# Patient Record
Sex: Male | Born: 1980 | Race: Black or African American | Hispanic: No | Smoking: Current every day smoker
Health system: Southern US, Community
[De-identification: ages and names within clinical notes are randomized; demographics above are authoritative.]

## PROBLEM LIST (undated history)

## (undated) DIAGNOSIS — I10 Essential (primary) hypertension: Secondary | ICD-10-CM

## (undated) DIAGNOSIS — M0520 Rheumatoid vasculitis with rheumatoid arthritis of unspecified site: Secondary | ICD-10-CM

## (undated) DIAGNOSIS — F1910 Other psychoactive substance abuse, uncomplicated: Secondary | ICD-10-CM

## (undated) DIAGNOSIS — M1990 Unspecified osteoarthritis, unspecified site: Secondary | ICD-10-CM

## (undated) DIAGNOSIS — M779 Enthesopathy, unspecified: Secondary | ICD-10-CM

## (undated) DIAGNOSIS — D649 Anemia, unspecified: Secondary | ICD-10-CM

## (undated) HISTORY — DX: Essential (primary) hypertension: I10

## (undated) HISTORY — DX: Other psychoactive substance abuse, uncomplicated: F19.10

## (undated) HISTORY — DX: Anemia, unspecified: D64.9

---
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
## 2001-07-15 ENCOUNTER — Emergency Department (HOSPITAL_COMMUNITY): Admission: EM | Admit: 2001-07-15 | Discharge: 2001-07-15 | Payer: Self-pay | Admitting: Emergency Medicine

## 2001-07-27 ENCOUNTER — Emergency Department (HOSPITAL_COMMUNITY): Admission: EM | Admit: 2001-07-27 | Discharge: 2001-07-27 | Payer: Self-pay | Admitting: Emergency Medicine

## 2001-09-27 ENCOUNTER — Emergency Department (HOSPITAL_COMMUNITY): Admission: EM | Admit: 2001-09-27 | Discharge: 2001-09-27 | Payer: Self-pay | Admitting: Emergency Medicine

## 2003-07-04 ENCOUNTER — Emergency Department (HOSPITAL_COMMUNITY): Admission: EM | Admit: 2003-07-04 | Discharge: 2003-07-04 | Payer: Self-pay | Admitting: Emergency Medicine

## 2005-02-24 ENCOUNTER — Emergency Department: Payer: Self-pay | Admitting: Emergency Medicine

## 2005-02-25 ENCOUNTER — Other Ambulatory Visit: Payer: Self-pay

## 2005-07-13 ENCOUNTER — Emergency Department: Payer: Self-pay | Admitting: Emergency Medicine

## 2006-02-22 ENCOUNTER — Emergency Department (HOSPITAL_COMMUNITY): Admission: EM | Admit: 2006-02-22 | Discharge: 2006-02-22 | Payer: Self-pay | Admitting: *Deleted

## 2006-03-07 ENCOUNTER — Emergency Department (HOSPITAL_COMMUNITY): Admission: EM | Admit: 2006-03-07 | Discharge: 2006-03-07 | Payer: Self-pay | Admitting: Emergency Medicine

## 2006-12-20 ENCOUNTER — Emergency Department (HOSPITAL_COMMUNITY): Admission: EM | Admit: 2006-12-20 | Discharge: 2006-12-20 | Payer: Self-pay | Admitting: Emergency Medicine

## 2007-06-01 ENCOUNTER — Emergency Department (HOSPITAL_COMMUNITY): Admission: EM | Admit: 2007-06-01 | Discharge: 2007-06-01 | Payer: Self-pay | Admitting: Emergency Medicine

## 2007-07-24 ENCOUNTER — Emergency Department (HOSPITAL_COMMUNITY): Admission: EM | Admit: 2007-07-24 | Discharge: 2007-07-24 | Payer: Self-pay | Admitting: Emergency Medicine

## 2007-08-29 ENCOUNTER — Emergency Department (HOSPITAL_COMMUNITY): Admission: EM | Admit: 2007-08-29 | Discharge: 2007-08-29 | Payer: Self-pay | Admitting: Emergency Medicine

## 2007-12-18 ENCOUNTER — Emergency Department (HOSPITAL_COMMUNITY): Admission: EM | Admit: 2007-12-18 | Discharge: 2007-12-18 | Payer: Self-pay | Admitting: Emergency Medicine

## 2008-02-19 ENCOUNTER — Emergency Department (HOSPITAL_COMMUNITY): Admission: EM | Admit: 2008-02-19 | Discharge: 2008-02-19 | Payer: Self-pay | Admitting: Emergency Medicine

## 2008-03-24 ENCOUNTER — Emergency Department (HOSPITAL_COMMUNITY): Admission: EM | Admit: 2008-03-24 | Discharge: 2008-03-24 | Payer: Self-pay | Admitting: Emergency Medicine

## 2008-08-12 ENCOUNTER — Emergency Department (HOSPITAL_COMMUNITY): Admission: EM | Admit: 2008-08-12 | Discharge: 2008-08-12 | Payer: Self-pay | Admitting: Emergency Medicine

## 2008-08-14 ENCOUNTER — Ambulatory Visit: Payer: Self-pay | Admitting: *Deleted

## 2008-08-14 ENCOUNTER — Observation Stay (HOSPITAL_COMMUNITY): Admission: EM | Admit: 2008-08-14 | Discharge: 2008-08-15 | Payer: Self-pay | Admitting: Emergency Medicine

## 2008-09-05 ENCOUNTER — Emergency Department (HOSPITAL_COMMUNITY): Admission: EM | Admit: 2008-09-05 | Discharge: 2008-09-05 | Payer: Self-pay | Admitting: Emergency Medicine

## 2008-09-30 ENCOUNTER — Emergency Department (HOSPITAL_COMMUNITY): Admission: EM | Admit: 2008-09-30 | Discharge: 2008-09-30 | Payer: Self-pay | Admitting: Emergency Medicine

## 2008-11-02 ENCOUNTER — Emergency Department (HOSPITAL_COMMUNITY): Admission: EM | Admit: 2008-11-02 | Discharge: 2008-11-02 | Payer: Self-pay | Admitting: Emergency Medicine

## 2008-11-27 ENCOUNTER — Inpatient Hospital Stay (HOSPITAL_COMMUNITY): Admission: EM | Admit: 2008-11-27 | Discharge: 2008-11-30 | Payer: Self-pay | Admitting: Emergency Medicine

## 2008-11-28 ENCOUNTER — Encounter (INDEPENDENT_AMBULATORY_CARE_PROVIDER_SITE_OTHER): Payer: Self-pay | Admitting: Internal Medicine

## 2008-11-28 ENCOUNTER — Ambulatory Visit: Payer: Self-pay | Admitting: Vascular Surgery

## 2008-12-24 ENCOUNTER — Observation Stay (HOSPITAL_COMMUNITY): Admission: EM | Admit: 2008-12-24 | Discharge: 2008-12-25 | Payer: Self-pay | Admitting: Emergency Medicine

## 2008-12-25 ENCOUNTER — Encounter: Payer: Self-pay | Admitting: Internal Medicine

## 2008-12-25 DIAGNOSIS — M109 Gout, unspecified: Secondary | ICD-10-CM

## 2009-01-10 ENCOUNTER — Emergency Department (HOSPITAL_COMMUNITY): Admission: EM | Admit: 2009-01-10 | Discharge: 2009-01-10 | Payer: Self-pay | Admitting: Emergency Medicine

## 2009-01-10 ENCOUNTER — Encounter: Payer: Self-pay | Admitting: Internal Medicine

## 2009-01-11 ENCOUNTER — Ambulatory Visit: Payer: Self-pay | Admitting: Internal Medicine

## 2009-01-11 ENCOUNTER — Ambulatory Visit (HOSPITAL_COMMUNITY): Admission: RE | Admit: 2009-01-11 | Discharge: 2009-01-11 | Payer: Self-pay | Admitting: Internal Medicine

## 2009-01-11 ENCOUNTER — Encounter (INDEPENDENT_AMBULATORY_CARE_PROVIDER_SITE_OTHER): Payer: Self-pay | Admitting: Internal Medicine

## 2009-01-11 ENCOUNTER — Ambulatory Visit (HOSPITAL_COMMUNITY): Admission: RE | Admit: 2009-01-11 | Discharge: 2009-01-11 | Payer: Self-pay | Admitting: *Deleted

## 2009-01-11 DIAGNOSIS — IMO0002 Reserved for concepts with insufficient information to code with codable children: Secondary | ICD-10-CM

## 2009-01-11 DIAGNOSIS — R079 Chest pain, unspecified: Secondary | ICD-10-CM

## 2009-01-19 ENCOUNTER — Observation Stay (HOSPITAL_COMMUNITY): Admission: AD | Admit: 2009-01-19 | Discharge: 2009-01-21 | Payer: Self-pay | Admitting: Internal Medicine

## 2009-01-19 ENCOUNTER — Telehealth: Payer: Self-pay | Admitting: *Deleted

## 2009-01-19 ENCOUNTER — Encounter (INDEPENDENT_AMBULATORY_CARE_PROVIDER_SITE_OTHER): Payer: Self-pay | Admitting: Internal Medicine

## 2009-01-21 ENCOUNTER — Encounter (INDEPENDENT_AMBULATORY_CARE_PROVIDER_SITE_OTHER): Payer: Self-pay | Admitting: Internal Medicine

## 2009-02-03 ENCOUNTER — Emergency Department (HOSPITAL_COMMUNITY): Admission: EM | Admit: 2009-02-03 | Discharge: 2009-02-03 | Payer: Self-pay | Admitting: Emergency Medicine

## 2009-03-19 ENCOUNTER — Emergency Department (HOSPITAL_COMMUNITY): Admission: EM | Admit: 2009-03-19 | Discharge: 2009-03-19 | Payer: Self-pay | Admitting: Emergency Medicine

## 2009-04-16 ENCOUNTER — Emergency Department (HOSPITAL_COMMUNITY): Admission: EM | Admit: 2009-04-16 | Discharge: 2009-04-16 | Payer: Self-pay | Admitting: Emergency Medicine

## 2009-04-17 ENCOUNTER — Emergency Department (HOSPITAL_COMMUNITY): Admission: EM | Admit: 2009-04-17 | Discharge: 2009-04-17 | Payer: Self-pay | Admitting: Emergency Medicine

## 2009-04-17 ENCOUNTER — Telehealth (INDEPENDENT_AMBULATORY_CARE_PROVIDER_SITE_OTHER): Payer: Self-pay | Admitting: Internal Medicine

## 2009-04-20 ENCOUNTER — Ambulatory Visit: Payer: Self-pay | Admitting: Internal Medicine

## 2009-04-20 ENCOUNTER — Encounter: Payer: Self-pay | Admitting: Internal Medicine

## 2009-04-20 DIAGNOSIS — K219 Gastro-esophageal reflux disease without esophagitis: Secondary | ICD-10-CM

## 2009-04-20 LAB — CONVERTED CEMR LAB
Calcium: 9.4 mg/dL (ref 8.4–10.5)
Creatinine, Ser: 1.04 mg/dL (ref 0.40–1.50)
Glucose, Bld: 88 mg/dL (ref 70–99)
Sodium: 142 meq/L (ref 135–145)

## 2009-05-27 ENCOUNTER — Emergency Department (HOSPITAL_COMMUNITY): Admission: EM | Admit: 2009-05-27 | Discharge: 2009-05-27 | Payer: Self-pay | Admitting: Emergency Medicine

## 2009-06-26 ENCOUNTER — Emergency Department (HOSPITAL_COMMUNITY): Admission: EM | Admit: 2009-06-26 | Discharge: 2009-06-26 | Payer: Self-pay | Admitting: Emergency Medicine

## 2009-06-30 ENCOUNTER — Emergency Department (HOSPITAL_COMMUNITY): Admission: EM | Admit: 2009-06-30 | Discharge: 2009-06-30 | Payer: Self-pay | Admitting: Emergency Medicine

## 2009-09-03 ENCOUNTER — Emergency Department (HOSPITAL_COMMUNITY): Admission: EM | Admit: 2009-09-03 | Discharge: 2009-09-03 | Payer: Self-pay | Admitting: Emergency Medicine

## 2009-09-15 ENCOUNTER — Emergency Department (HOSPITAL_COMMUNITY): Admission: EM | Admit: 2009-09-15 | Discharge: 2009-09-15 | Payer: Self-pay | Admitting: Emergency Medicine

## 2009-09-17 ENCOUNTER — Emergency Department (HOSPITAL_COMMUNITY): Admission: EM | Admit: 2009-09-17 | Discharge: 2009-09-17 | Payer: Self-pay | Admitting: Emergency Medicine

## 2009-10-02 ENCOUNTER — Emergency Department (HOSPITAL_COMMUNITY): Admission: EM | Admit: 2009-10-02 | Discharge: 2009-10-03 | Payer: Self-pay | Admitting: Emergency Medicine

## 2009-10-04 ENCOUNTER — Ambulatory Visit: Payer: Self-pay | Admitting: Internal Medicine

## 2009-10-04 ENCOUNTER — Observation Stay (HOSPITAL_COMMUNITY): Admission: EM | Admit: 2009-10-04 | Discharge: 2009-10-05 | Payer: Self-pay | Admitting: Emergency Medicine

## 2009-10-05 ENCOUNTER — Encounter: Payer: Self-pay | Admitting: Internal Medicine

## 2009-10-30 ENCOUNTER — Ambulatory Visit: Payer: Self-pay | Admitting: Internal Medicine

## 2009-10-30 DIAGNOSIS — M79609 Pain in unspecified limb: Secondary | ICD-10-CM

## 2009-11-10 ENCOUNTER — Emergency Department (HOSPITAL_COMMUNITY): Admission: EM | Admit: 2009-11-10 | Discharge: 2009-11-10 | Payer: Self-pay | Admitting: Emergency Medicine

## 2009-12-10 ENCOUNTER — Emergency Department (HOSPITAL_COMMUNITY): Admission: EM | Admit: 2009-12-10 | Discharge: 2009-12-10 | Payer: Self-pay | Admitting: Emergency Medicine

## 2010-01-04 ENCOUNTER — Emergency Department (HOSPITAL_COMMUNITY): Admission: EM | Admit: 2010-01-04 | Discharge: 2010-01-05 | Payer: Self-pay | Admitting: Emergency Medicine

## 2010-02-02 ENCOUNTER — Ambulatory Visit: Payer: Self-pay | Admitting: Internal Medicine

## 2010-02-02 ENCOUNTER — Encounter: Payer: Self-pay | Admitting: Internal Medicine

## 2010-02-02 DIAGNOSIS — M25569 Pain in unspecified knee: Secondary | ICD-10-CM

## 2010-02-02 LAB — CONVERTED CEMR LAB
ALT: 26 units/L (ref 0–53)
AST: 29 units/L (ref 0–37)
Albumin: 4.2 g/dL (ref 3.5–5.2)
Alkaline Phosphatase: 58 units/L (ref 39–117)
BUN: 11 mg/dL (ref 6–23)
Basophils Absolute: 0 10*3/uL (ref 0.0–0.1)
Calcium: 9.6 mg/dL (ref 8.4–10.5)
Chloride: 107 meq/L (ref 96–112)
Eosinophils Relative: 1 % (ref 0–5)
HCT: 43 % (ref 39.0–52.0)
Lymphocytes Relative: 18 % (ref 12–46)
Lymphs Abs: 2.7 10*3/uL (ref 0.7–4.0)
Neutro Abs: 10.8 10*3/uL — ABNORMAL HIGH (ref 1.7–7.7)
Platelets: 206 10*3/uL (ref 150–400)
Potassium: 4.4 meq/L (ref 3.5–5.3)
Sodium: 138 meq/L (ref 135–145)
Total Protein: 7.2 g/dL (ref 6.0–8.3)
WBC: 14.9 10*3/uL — ABNORMAL HIGH (ref 4.0–10.5)

## 2010-02-03 ENCOUNTER — Ambulatory Visit (HOSPITAL_COMMUNITY): Admission: RE | Admit: 2010-02-03 | Discharge: 2010-02-03 | Payer: Self-pay | Admitting: Internal Medicine

## 2010-02-07 ENCOUNTER — Ambulatory Visit: Payer: Self-pay | Admitting: Internal Medicine

## 2010-02-07 LAB — CONVERTED CEMR LAB
ALT: 21 units/L (ref 0–53)
AST: 27 units/L (ref 0–37)
BUN: 16 mg/dL (ref 6–23)
Calcium: 10.1 mg/dL (ref 8.4–10.5)
Creatinine, Ser: 1.09 mg/dL (ref 0.40–1.50)
Total Bilirubin: 0.3 mg/dL (ref 0.3–1.2)

## 2010-02-08 IMAGING — CR DG FOOT COMPLETE 3+V*R*
3 series · 3 of 3 positions shown · non-contrast
Comparison: None available.

CLINICAL DATA: Foot pain and swelling.

RIGHT FOOT COMPLETE - 3+ VIEW

[t foot ap right]
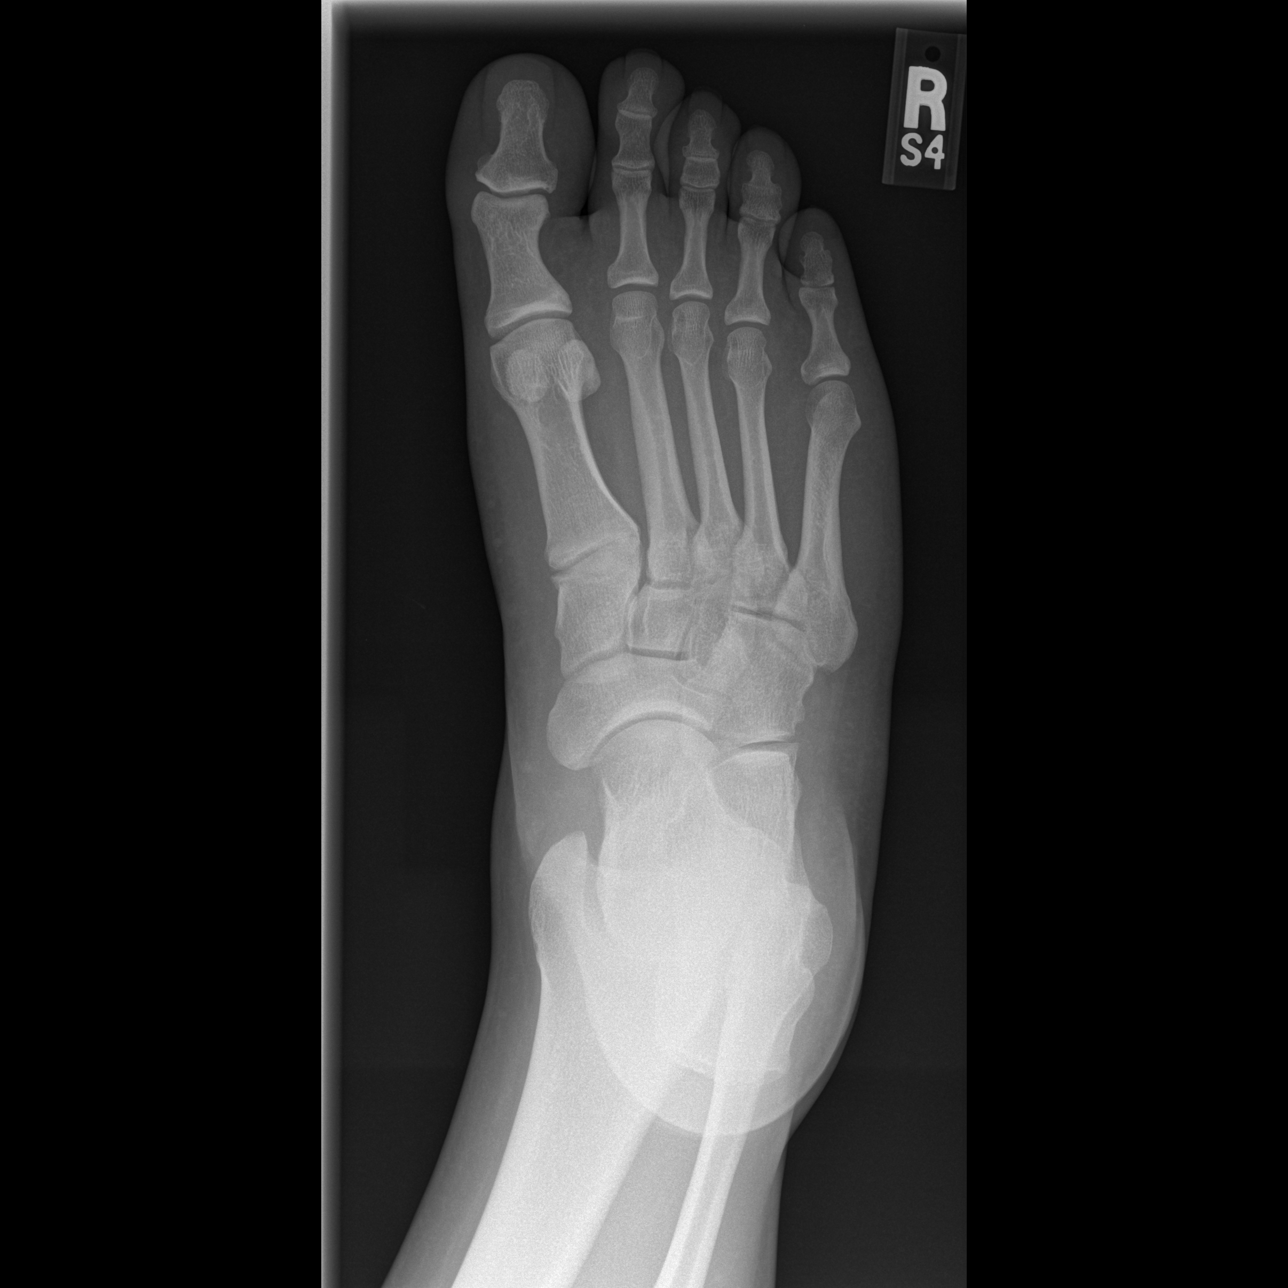

[t foot oblique right]
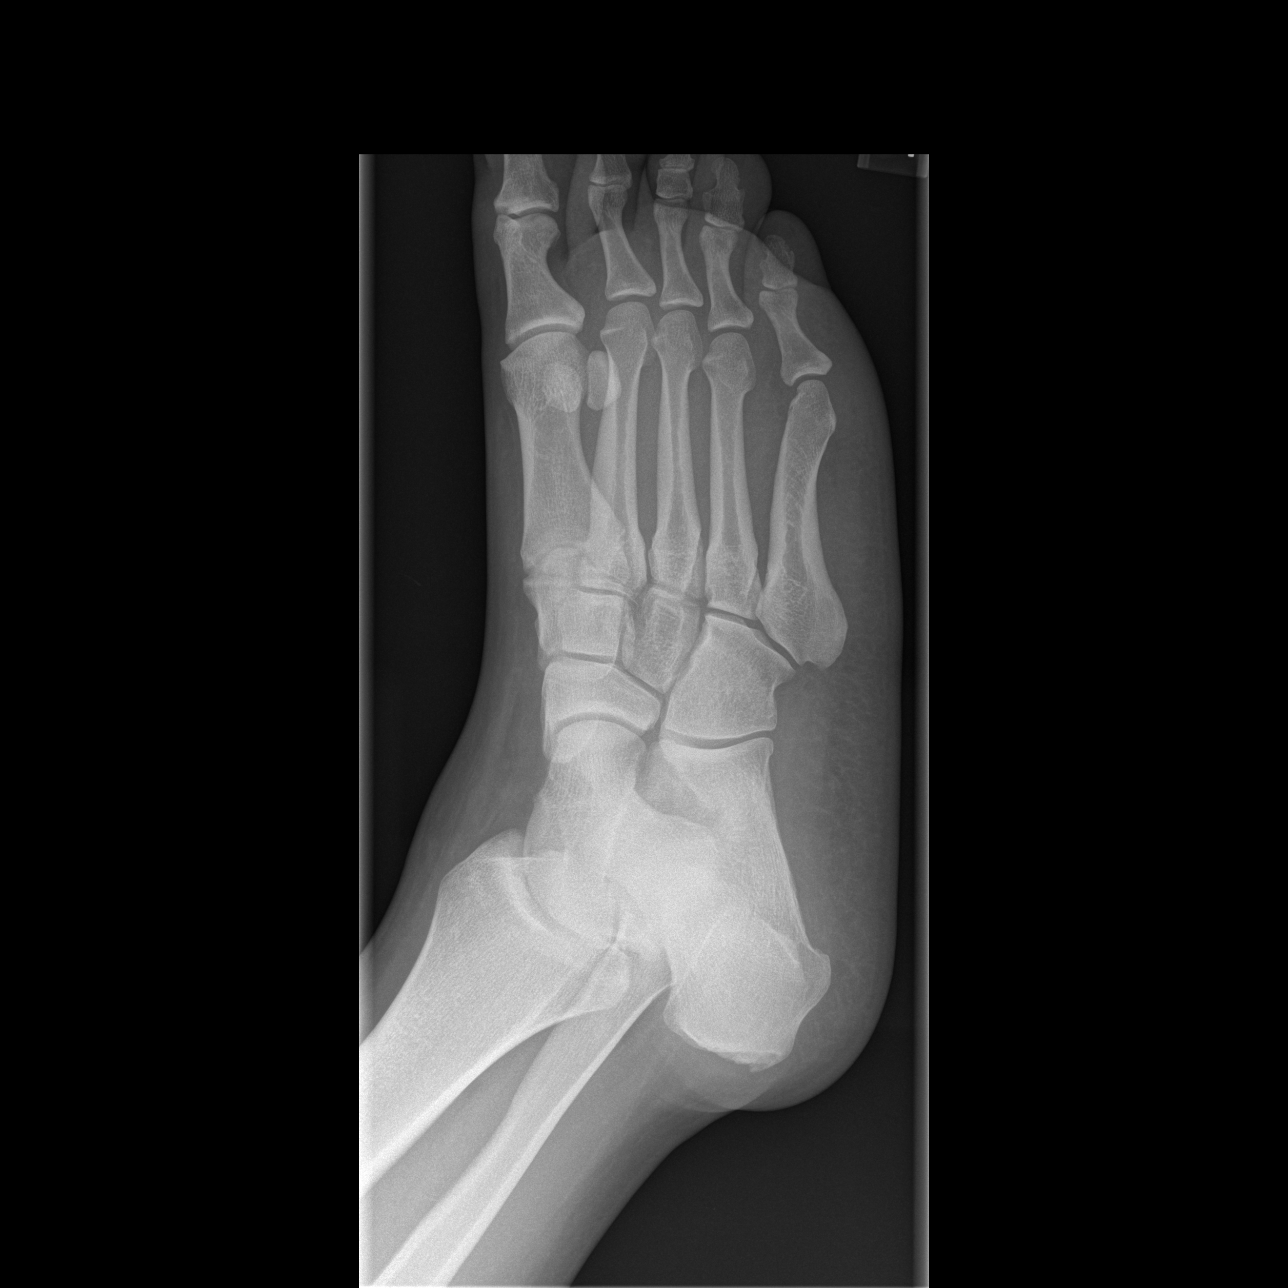

[t foot lat right]
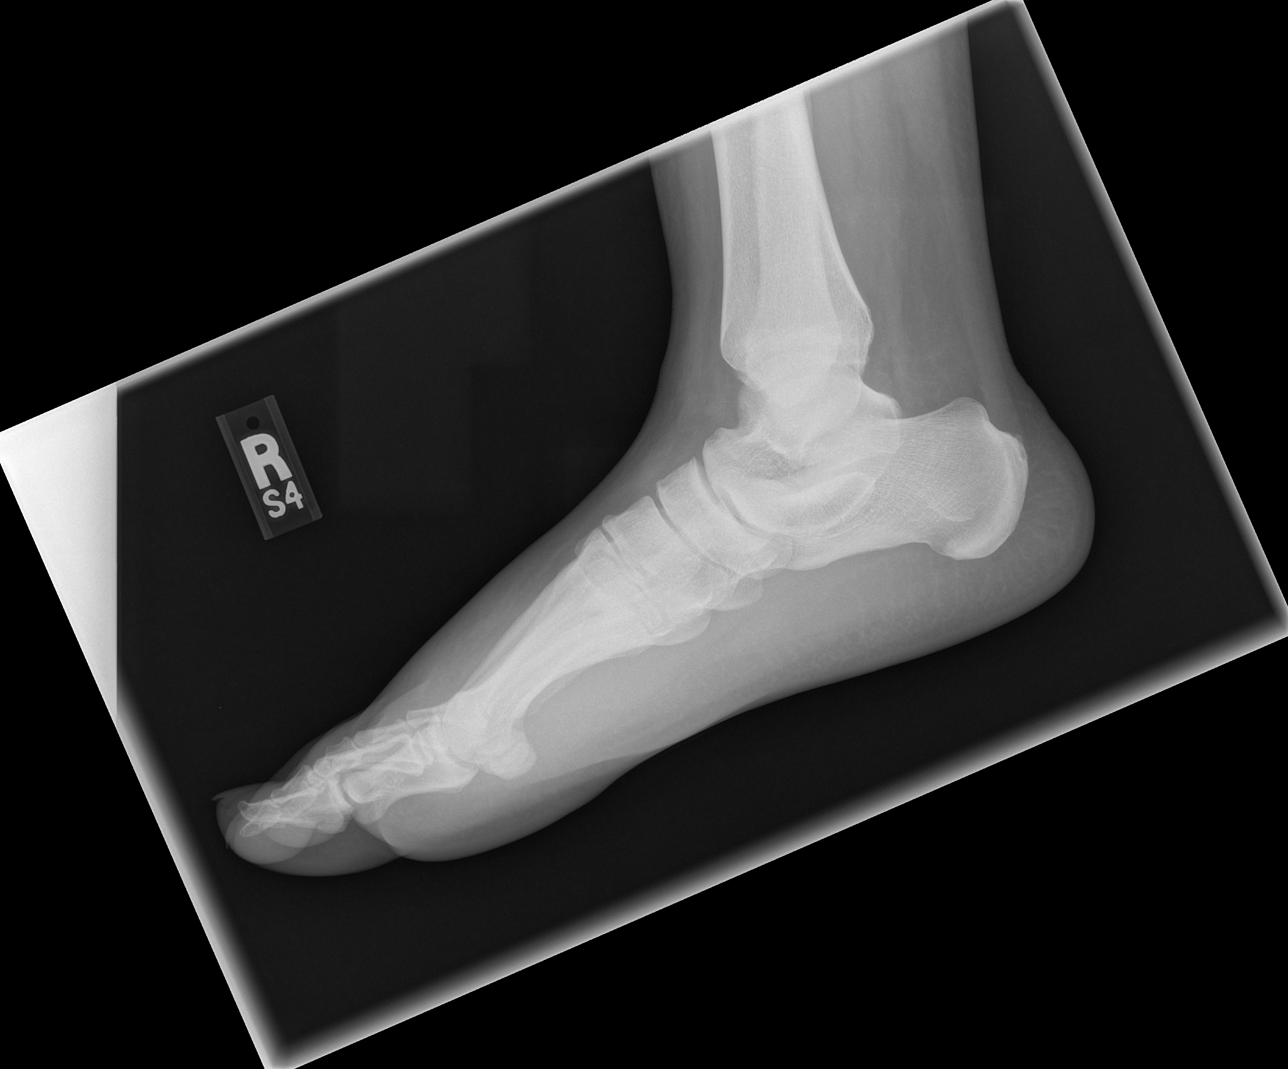

[3 of 3 positions shown; findings below may reference images not displayed]

FINDINGS: Imaged bones, joints and soft tissues appear normal.
IMPRESSION: Negative exam.

## 2010-02-14 ENCOUNTER — Emergency Department (HOSPITAL_COMMUNITY): Admission: EM | Admit: 2010-02-14 | Discharge: 2010-02-14 | Payer: Self-pay | Admitting: Emergency Medicine

## 2010-02-22 ENCOUNTER — Emergency Department (HOSPITAL_COMMUNITY): Admission: EM | Admit: 2010-02-22 | Discharge: 2010-02-22 | Payer: Self-pay | Admitting: Family Medicine

## 2010-03-21 ENCOUNTER — Ambulatory Visit (HOSPITAL_COMMUNITY): Admission: RE | Admit: 2010-03-21 | Discharge: 2010-03-21 | Payer: Self-pay | Admitting: Internal Medicine

## 2010-03-21 ENCOUNTER — Ambulatory Visit: Payer: Self-pay | Admitting: Internal Medicine

## 2010-03-21 ENCOUNTER — Encounter (INDEPENDENT_AMBULATORY_CARE_PROVIDER_SITE_OTHER): Payer: Self-pay | Admitting: Internal Medicine

## 2010-03-21 DIAGNOSIS — M25559 Pain in unspecified hip: Secondary | ICD-10-CM

## 2010-03-26 DIAGNOSIS — R748 Abnormal levels of other serum enzymes: Secondary | ICD-10-CM | POA: Insufficient documentation

## 2010-03-27 LAB — CONVERTED CEMR LAB
Amphetamine Screen, Ur: NEGATIVE
Basophils Absolute: 0 10*3/uL (ref 0.0–0.1)
CRP: 0.2 mg/dL (ref <0.6)
Cocaine Metabolites: NEGATIVE
Creatinine,U: 202.6 mg/dL
Eosinophils Absolute: 0.2 10*3/uL (ref 0.0–0.7)
Eosinophils Relative: 2 % (ref 0–5)
HCT: 39.8 % (ref 39.0–52.0)
Hemoglobin: 13.2 g/dL (ref 13.0–17.0)
Lymphocytes Relative: 23 % (ref 12–46)
Lymphs Abs: 3 10*3/uL (ref 0.7–4.0)
MCV: 68.9 fL — ABNORMAL LOW (ref >=78.0)
Monocytes Absolute: 1 10*3/uL (ref 0.1–1.0)
Phencyclidine (PCP): NEGATIVE
Platelets: 283 10*3/uL (ref 150–400)
RDW: 15.5 % (ref 11.5–15.5)
Total CK: 805 units/L — ABNORMAL HIGH (ref 7–232)

## 2010-04-03 ENCOUNTER — Telehealth: Payer: Self-pay | Admitting: *Deleted

## 2010-04-04 ENCOUNTER — Telehealth: Payer: Self-pay | Admitting: *Deleted

## 2010-05-22 ENCOUNTER — Emergency Department (HOSPITAL_COMMUNITY): Admission: EM | Admit: 2010-05-22 | Discharge: 2010-05-23 | Payer: Self-pay | Admitting: Emergency Medicine

## 2010-06-12 ENCOUNTER — Emergency Department (HOSPITAL_COMMUNITY): Admission: EM | Admit: 2010-06-12 | Discharge: 2010-06-13 | Payer: Self-pay | Admitting: Emergency Medicine

## 2010-07-04 ENCOUNTER — Emergency Department (HOSPITAL_COMMUNITY): Admission: EM | Admit: 2010-07-04 | Discharge: 2010-07-04 | Payer: Self-pay | Admitting: Emergency Medicine

## 2010-07-05 ENCOUNTER — Ambulatory Visit: Payer: Self-pay | Admitting: Vascular Surgery

## 2010-07-05 ENCOUNTER — Emergency Department (HOSPITAL_COMMUNITY): Admission: EM | Admit: 2010-07-05 | Discharge: 2010-07-05 | Payer: Self-pay | Admitting: Emergency Medicine

## 2010-07-05 ENCOUNTER — Encounter (INDEPENDENT_AMBULATORY_CARE_PROVIDER_SITE_OTHER): Payer: Self-pay | Admitting: Emergency Medicine

## 2010-07-05 ENCOUNTER — Telehealth: Payer: Self-pay | Admitting: Internal Medicine

## 2010-07-23 ENCOUNTER — Emergency Department (HOSPITAL_COMMUNITY): Admission: EM | Admit: 2010-07-23 | Discharge: 2010-07-23 | Payer: Self-pay | Admitting: Emergency Medicine

## 2010-09-27 ENCOUNTER — Emergency Department (HOSPITAL_COMMUNITY): Admission: EM | Admit: 2010-09-27 | Discharge: 2010-02-13 | Payer: Self-pay | Admitting: Emergency Medicine

## 2010-11-12 ENCOUNTER — Emergency Department (HOSPITAL_COMMUNITY)
Admission: EM | Admit: 2010-11-12 | Discharge: 2010-11-12 | Payer: Self-pay | Source: Home / Self Care | Admitting: Emergency Medicine

## 2010-11-13 LAB — CBC
HCT: 37.5 % — ABNORMAL LOW (ref 39.0–52.0)
Hemoglobin: 12.9 g/dL — ABNORMAL LOW (ref 13.0–17.0)
MCH: 23 pg — ABNORMAL LOW (ref 26.0–34.0)
MCHC: 34.4 g/dL (ref 30.0–36.0)
MCV: 67 fL — ABNORMAL LOW (ref 78.0–100.0)
Platelets: 204 K/uL (ref 150–400)
RBC: 5.6 MIL/uL (ref 4.22–5.81)
RDW: 14.4 % (ref 11.5–15.5)
WBC: 14.2 10*3/uL — ABNORMAL HIGH (ref 4.0–10.5)

## 2010-11-13 LAB — DIFFERENTIAL
Basophils Absolute: 0 K/uL (ref 0.0–0.1)
Basophils Relative: 0 % (ref 0–1)
Eosinophils Absolute: 0.1 K/uL (ref 0.0–0.7)
Eosinophils Relative: 1 % (ref 0–5)
Lymphocytes Relative: 31 % (ref 12–46)
Lymphs Abs: 4.4 K/uL — ABNORMAL HIGH (ref 0.7–4.0)
Monocytes Absolute: 1.1 K/uL — ABNORMAL HIGH (ref 0.1–1.0)
Monocytes Relative: 8 % (ref 3–12)
Neutro Abs: 8.6 K/uL — ABNORMAL HIGH (ref 1.7–7.7)
Neutrophils Relative %: 60 % (ref 43–77)

## 2010-11-13 LAB — COMPREHENSIVE METABOLIC PANEL
ALT: 20 U/L (ref 0–53)
Albumin: 3.7 g/dL (ref 3.5–5.2)
Alkaline Phosphatase: 53 U/L (ref 39–117)
BUN: 11 mg/dL (ref 6–23)
Chloride: 108 mEq/L (ref 96–112)
Glucose, Bld: 99 mg/dL (ref 70–99)
Potassium: 3.9 mEq/L (ref 3.5–5.1)
Sodium: 140 mEq/L (ref 135–145)
Total Bilirubin: 0.7 mg/dL (ref 0.3–1.2)

## 2010-11-13 LAB — COMPREHENSIVE METABOLIC PANEL WITH GFR
AST: 25 U/L (ref 0–37)
CO2: 25 meq/L (ref 19–32)
Calcium: 9.4 mg/dL (ref 8.4–10.5)
Creatinine, Ser: 1.14 mg/dL (ref 0.4–1.5)
GFR calc Af Amer: >60 mL/min (ref >60)
GFR calc non Af Amer: >60 mL/min (ref >60)
Total Protein: 6.5 g/dL (ref 6.0–8.3)

## 2010-11-13 LAB — LIPASE, BLOOD: Lipase: 24 U/L (ref 11–59)

## 2010-11-20 NOTE — Letter (Signed)
Summary: RETURN TO WORK NOTE  RETURN TO WORK NOTE   Imported By: Margie Billet 03/28/2010 11:02:17  _____________________________________________________________________  External Attachment:    Type:   Image     Comment:   External Document

## 2010-11-20 NOTE — Letter (Signed)
Summary: Generic on Letterhead Paper  [facility]  [address]   [place], [place] [address]   Phone: [phone]  Fax: [phone]     Today's Date:  February 02, 2010  Re:  [name]   To Whom It May Concern:   For Medical reasons, please excuse the above named employee from work for the following dates:  Start: 02/02/2010  End: 02/17/2010  If you need additional information, please feel free to contact our office at [phone].         Sincerely,  Darnelle Maffucci, MD.         Sincerely,    Darnelle Maffucci MD

## 2010-11-20 NOTE — Assessment & Plan Note (Signed)
Summary: per Dr. Gilford Rile hfu [mkj]   Vital Signs:  Patient profile:   30 year old male Height:      67 inches Weight:      250.3 pounds (113.77 kg) BMI:     39.34 Temp:     98.6 degrees F (37.00 degrees C) oral Pulse rate:   69 / minute BP sitting:   143 / 94  (left arm) Cuff size:   large  Vitals Entered By: Krystal Eaton Duncan Dull) (October 30, 2009 3:53 PM) CC: hfu-c/p left knee "popping" also c/o bilateral foot pain ?gout, medication refill Is Patient Diabetic? No Pain Assessment Patient in pain? yes     Location: feet Intensity: 10 Type: sharp Onset of pain  Intermittent since d/c in 12/ 2010 Nutritional Status BMI of 19 -24 = normal  Have you ever been in a relationship where you felt threatened, hurt or afraid?No   Does patient need assistance? Functional Status Self care Ambulation Normal   CC:  hfu-c/p left knee "popping" also c/o bilateral foot pain ?gout and medication refill.  History of Present Illness: The patient is a 30 year old male with sickle cell trait, mild essential hypertension and hx of polysubstance abuse (alcohol, marijuana, cocaine and tobacco). Who came to clinic for followup of left foot pain after being hospitalized secondary to left foot pain and swelling of his left ankle.  Pt has been experiencing simillar complaints and had multiple ER visits and even hospitalization 2/2 to same issue.  He was empirically treated for Gout using indomethacin and also prednisone tapering, and he had some improvement in the swelling and pain of his joint. Today he is here for followup on his foot pain and to been started on maintenance treatment for his joint pain (?? Gout).  Pt is having trouble bearing weight, but denies any fever, chills, denies any other joint pain, rashes, dysuria or any other systemic complaints.   Has some chest tigness sometimes this is chronic issue which has been worked up in the past.    Press photographer &  Management  Alcohol-Tobacco     Alcohol drinks/day: 1-2 /week     Alcohol type: beer     Smoking Status: current     Smoking Cessation Counseling: yes     Smoke Cessation Stage: contemplative     Packs/Day: 0.25     Year Started: age 42 yrs. old  Current Medications (verified): 1)  Prevacid Solutab 30 Mg Tbdp (Lansoprazole) .... Take 1 Tablet By Mouth Twice A Day. 2)  Coreg 6.25 Mg Tabs (Carvedilol) .... Take 1 Pill By Mouth Two Times A Day 3)  Ultracet 37.5-325 Mg Tabs (Tramadol-Acetaminophen) .... Take 1 - 2 Tablets Every 6 Hours As Needed For Pain. 4)  Flexeril 5 Mg Tabs (Cyclobenzaprine Hcl) .... Take 1 Tablet By Mouth Three Times Day  Allergies: 1)  ! * Robitussin 2)  ! Penicillin 3)  ! Ampicillin 4)  ! Amoxicillin 5)  ! * Cillins  Social History: Packs/Day:  0.25  Review of Systems       as per HPI  Physical Exam  General:  alert, well-developed, and cooperative to examination.    Mouth:   pharynx pink and moist, no erythema, and no exudates.    Lungs:  normal respiratory effort, no accessory muscle use, normal breath sounds, no crackles, and no wheezes.  Heart:  normal rate, regular rhythm, no murmur, no gallop, and no rub.    Abdomen:  soft, non-tender, normal bowel sounds,  no distention, no guarding, no rebound tenderness, no hepatomegaly, and no splenomegaly.    Msk:  no joint swelling, no joint warmth, and no redness over joints.    Pulses:  2+ DP/PT pulses bilaterally  Extremities:  No cyanosis, clubbing, edema    Impression & Recommendations:  Problem # 1:  FOOT PAIN, BILATERAL (ICD-729.5) at hospital admission we performed multiple tests including CRP, which was at 1.4.  ESR was 5, uric acid was 5.6.  X-ray of left foot, which showed no signs of osteomyelitis and no clear signs of inflammation or infection.  The patient's CK was 480.  Further tests such as gonorrhea and Chlamydia have been considered, which were negative.  Rheumatoid factor is negative. HIV  was negative. Will give a prescription for Ultram/acmp and flexiril which has helped him in the past. I have advised the patient on the use of other NSAIDS. The main aim due to patients history of drug abuse, we do not want to start opiates long term on him unless strongly indicated.  will recheck in 3 months.   Problem # 2:  HYPERTENSION NEC (ICD-997.91) today BP 143/94, continue to monitor.   Problem # 3:  CHEST PAIN (ICD-786.50) pain reproducible to palpation, probably 2/2 to job. patient had ECHO in 01/2009 which was normal with EF 65%  Problem # 4:  GERD (ICD-530.81) no symptoms today, cont to monitor  His updated medication list for this problem includes:    Prevacid Solutab 30 Mg Tbdp (Lansoprazole) .Marland Kitchen... Take 1 tablet by mouth twice a day.  Complete Medication List: 1)  Prevacid Solutab 30 Mg Tbdp (Lansoprazole) .... Take 1 tablet by mouth twice a day. 2)  Coreg 6.25 Mg Tabs (Carvedilol) .... Take 1 pill by mouth two times a day 3)  Ultracet 37.5-325 Mg Tabs (Tramadol-acetaminophen) .... Take 1 - 2 tablets every 6 hours as needed for pain. 4)  Flexeril 5 Mg Tabs (Cyclobenzaprine hcl) .... Take 1 tablet by mouth three times day  Patient Instructions: 1)  Please schedule a follow-up appointment in 3 months. Prescriptions: FLEXERIL 5 MG TABS (CYCLOBENZAPRINE HCL) Take 1 tablet by mouth three times day  #90 x 0   Entered and Authorized by:   Darnelle Maffucci MD   Signed by:   Darnelle Maffucci MD on 10/30/2009   Method used:   Print then Give to Patient   RxID:   [id number] ULTRACET 37.5-325 MG TABS (TRAMADOL-ACETAMINOPHEN) take 1 - 2 tablets every 6 hours as needed for pain.  #120 x 3   Entered and Authorized by:   Darnelle Maffucci MD   Signed by:   Darnelle Maffucci MD on 10/30/2009   Method used:   Print then Give to Patient   RxID:   [id number]    Prevention & Chronic Care Immunizations   Influenza vaccine: Not documented   Influenza vaccine deferral: Deferred   (04/20/2009)   Influenza vaccine due: 06/21/2009    Tetanus booster: Not documented   Td booster deferral: Deferred  (04/20/2009)    Pneumococcal vaccine: Not documented   Pneumococcal vaccine deferral: Deferred  (04/20/2009)   Pneumococcal vaccine due: 04/20/2014  Other Screening   Smoking status: current  (10/30/2009)   Smoking cessation counseling: yes  (10/30/2009)

## 2010-11-20 NOTE — Assessment & Plan Note (Signed)
Summary: ACUTE-RIGHT KNEE F/U(SILWAL)/CFB   Vital Signs:  Patient profile:   30 year old male Height:      67 inches (170.18 cm) Weight:      250.8 pounds (114 kg) BMI:     39.42 Temp:     98.5 degrees F oral Pulse rate:   93 / minute BP sitting:   137 / 92  (right arm)  Vitals Entered By: Chinita Pester RN (February 07, 2010 9:39 AM) CC: F/U appt. for right knee; had MRI  Friday. Wearing knee brace. Is Patient Diabetic? No Pain Assessment Patient in pain? no      Nutritional Status BMI of > 30 = obese  Have you ever been in a relationship where you felt threatened, hurt or afraid?No   Does patient need assistance? Functional Status Self care Ambulation Impaired:Risk for fall   CC:  F/U appt. for right knee; had MRI  Friday. Wearing knee brace.Marland Kitchen  History of Present Illness: The patient is a 30 year old male with sickle cell trait, mild essential hypertension and hx of polysubstance abuse (alcohol, marijuana, cocaine and tobacco).  Pt has been experiencing simillar complaints in the past and had multiple ER visits and even hospitalization 2/2 to same issue.  He was empirically treated for Gout in the past using indomethacin and also prednisone tapering, and he had some improvement in the swelling and pain of his joint. Today he is here for followup on his foot pain and to been started on maintenance treatment for his joint pain (?? Gout).  on last visit he c/o knee R pain swelling and warmth, since this monday 01/29/10 has been getting worse. was about 10/10 intesity, sharp/burining/stabbing in quality, radiated up and down the leg,  it was getting worse over few days prior to visit, it was brought on by his knee giving out when he was trying to sit down on monday, It  was constant. it was alleviated by standing up, aggravated by laying down, associated with fever, sweating, Denies SOB, Denies CP.  Today patient states that he has had improvement of pain and swelling to knee, however  patient does report instability to his knee requiring him to wear a brace most of the day. patient denies locking of his knee.  Patient currently denies SOB, Denies CP, Denies fever, chills, nausea, vomiting, diarrhea, constipation and otherwise doing well and denies any other complaints.     Depression History:      The patient denies a depressed mood most of the day and a diminished interest in his usual daily activities.         Preventive Screening-Counseling & Management  Alcohol-Tobacco     Alcohol drinks/day: 1-2 /week     Alcohol type: beer     Smoking Status: current     Smoking Cessation Counseling: yes     Smoke Cessation Stage: contemplative     Packs/Day: 0.5     Year Started: age 50 yrs. old  Caffeine-Diet-Exercise     Does Patient Exercise: yes     Type of exercise: walking at work  Current Medications (verified): 1)  Prevacid Solutab 30 Mg Tbdp (Lansoprazole) .... Take 1 Tablet By Mouth Twice A Day. 2)  Coreg 6.25 Mg Tabs (Carvedilol) .... Take 1 Pill By Mouth Two Times A Day 3)  Ultracet 37.5-325 Mg Tabs (Tramadol-Acetaminophen) .... Take 1 - 2 Tablets Every 6 Hours As Needed For Pain. 4)  Flexeril 5 Mg Tabs (Cyclobenzaprine Hcl) .... Take 1 Tablet By  Mouth Three Times Day 5)  Percocet 5-325 Mg Tabs (Oxycodone-Acetaminophen) .... Take 1 Tablet By Mouth Every Four Hours As Needed For Pain. 6)  Diclofenac Sodium 75 Mg Tbec (Diclofenac Sodium) .... Take 1 Tablet By Mouth Two Times A Day  Allergies (verified): 1)  ! * Robitussin 2)  ! Penicillin 3)  ! Ampicillin 4)  ! Amoxicillin 5)  ! * Cillins  Review of Systems       Per HPI  Physical Exam  General:  alert, well-developed, and cooperative to examination.    Lungs:  normal respiratory effort, no accessory muscle use, normal breath sounds, no crackles, and no wheezes.  Heart:  normal rate, regular rhythm, no murmur, no gallop, and no rub.    Abdomen:  soft, non-tender, normal bowel sounds, no distention,  no guarding, no rebound tenderness, no hepatomegaly, and no splenomegaly.    Msk:  R knee swelling (mild)  Pulses:  2+ DP/PT pulses bilaterally  Extremities:  No cyanosis, clubbing, mild effusion in R leg around the knee. Neurologic:  No cranial nerve deficits noted. Sensory, motor and coordinative functions appear intact. gait not tested as patient not able to walk much 2/2 to pain.  Psych:  Oriented X3, memory intact for recent and remote, normally interactive, good eye contact, not anxious appearing, and not depressed appearing.    Impression & Recommendations:  Problem # 1:  KNEE PAIN, RIGHT, ACUTE (ICD-719.46) Patient complains of improvement of pain and swelling to knee, however patient does report instability to his knee requiring him to wear a brace most of the day. patient denies locking of his knee. Per MRI 1.  Extensive tear of the free edge of the entire medial meniscus with a displaced fragment centrally. 2.  Multiple small loose bodies in the joint. 3.  Large joint effusion. Also Patient had elevated Bilirubin on CMET thought to be 2/2 to bleed into joint.  Will make referral to Orthopedics for further eval and possible intervention.   His updated medication list for this problem includes:    Ultracet 37.5-325 Mg Tabs (Tramadol-acetaminophen) .Marland Kitchen... Take 1 - 2 tablets every 6 hours as needed for pain.    Flexeril 5 Mg Tabs (Cyclobenzaprine hcl) .Marland Kitchen... Take 1 tablet by mouth three times day    Percocet 5-325 Mg Tabs (Oxycodone-acetaminophen) .Marland Kitchen... Take 1 tablet by mouth every four hours as needed for pain.    Diclofenac Sodium 75 Mg Tbec (Diclofenac sodium) .Marland Kitchen... Take 1 tablet by mouth two times a day  Orders: T-Comprehensive Metabolic Panel ([id number]) Orthopedic Referral (Ortho)  Problem # 2:  HYPERTENSION NEC (ICD-997.91) BP within normal range, will continue to monitor.   Complete Medication List: 1)  Prevacid Solutab 30 Mg Tbdp (Lansoprazole) .... Take 1 tablet by mouth  twice a day. 2)  Coreg 6.25 Mg Tabs (Carvedilol) .... Take 1 pill by mouth two times a day 3)  Ultracet 37.5-325 Mg Tabs (Tramadol-acetaminophen) .... Take 1 - 2 tablets every 6 hours as needed for pain. 4)  Flexeril 5 Mg Tabs (Cyclobenzaprine hcl) .... Take 1 tablet by mouth three times day 5)  Percocet 5-325 Mg Tabs (Oxycodone-acetaminophen) .... Take 1 tablet by mouth every four hours as needed for pain. 6)  Diclofenac Sodium 75 Mg Tbec (Diclofenac sodium) .... Take 1 tablet by mouth two times a day  Patient Instructions: 1)  Please schedule a follow-up appointment in 1 month.  Prevention & Chronic Care Immunizations   Influenza vaccine: Not documented   Influenza vaccine  deferral: Deferred  (04/20/2009)   Influenza vaccine due: 06/21/2009    Tetanus booster: Not documented   Td booster deferral: Deferred  (04/20/2009)    Pneumococcal vaccine: Not documented   Pneumococcal vaccine deferral: Deferred  (04/20/2009)   Pneumococcal vaccine due: 04/20/2014  Other Screening   Smoking status: current  (02/07/2010)   Smoking cessation counseling: yes  (02/07/2010)      Resource handout printed.  Process Orders Check Orders Results:     [facility]: ABN not required for this insurance Tests Sent for requisitioning (February 07, 2010 11:25 AM):     02/07/2010: [facility] -- T-Comprehensive Metabolic Panel [phone] (signed)

## 2010-11-20 NOTE — Progress Notes (Signed)
  Phone Note Outgoing Call   Call placed by: Theotis Barrio NT II,  April 04, 2010 1:59 PM Call placed to: Patient Details for Reason: [facility] REFERRAL Summary of Call: CALLED PATIENT , GAVE HIM THE PHONE # TO [facility] SO HE CAN GET APPT MADE, THE OFFICE IS WAITING ON HIM TO CALL ( PHONE # [facility]= [phone].Lela Sturdivant NT II  April 04, 2010 2:00 PM

## 2010-11-20 NOTE — Assessment & Plan Note (Signed)
Summary: EST-1 MONTH F/U VISIT PER [facility]   Vital Signs:  Patient profile:   30 year old male Height:      67 inches (170.18 cm) Weight:      248.2 pounds (112.82 kg) BMI:     39.01 Pulse rate:   89 / minute BP sitting:   136 / 88  (left arm) Cuff size:   large  Vitals Entered By: Theotis Barrio NT II (March 21, 2010 10:49 AM) CC: PATIENT IS HERE  FOR PAIN IN R HIP DOWN TO FOOT   / GOUT Is Patient Diabetic? No Pain Assessment Patient in pain? yes     Location: RIGHT  Intensity: 10+AART  Have you ever been in a relationship where you felt threatened, hurt or afraid?No  Domestic Violence Intervention PA  Does patient need assistance? Functional Status Self care Ambulation Normal   CC:  PATIENT IS HERE  FOR PAIN IN R HIP DOWN TO FOOT   / GOUT.  History of Present Illness: [name] is a 30 yo man with pMH as outlined in chart.  He is here for an acute visit for right hip and right foot.  Pain just started back up 2 days ago.  Has had this same pain before.  Reports he can't walk on it and can't work.  Using crutches.  Denies fever or redness.  No midline back pain repoted.    Preventive Screening-Counseling & Management  Alcohol-Tobacco     Alcohol drinks/day: 1-2 /week     Alcohol type: beer     Smoking Status: current     Smoking Cessation Counseling: yes     Smoke Cessation Stage: contemplative     Packs/Day: 0.5     Year Started: age 33 yrs. old  Current Medications (verified): 1)  Prevacid Solutab 30 Mg Tbdp (Lansoprazole) .... Take 1 Tablet By Mouth Twice A Day. 2)  Coreg 6.25 Mg Tabs (Carvedilol) .... Take 1 Pill By Mouth Two Times A Day 3)  Ultracet 37.5-325 Mg Tabs (Tramadol-Acetaminophen) .... Take 1 - 2 Tablets Every 6 Hours As Needed For Pain. 4)  Flexeril 5 Mg Tabs (Cyclobenzaprine Hcl) .... Take 1 Tablet By Mouth Three Times Day 5)  Percocet 5-325 Mg Tabs (Oxycodone-Acetaminophen) .... Take 1 Tablet By Mouth Every Four Hours As Needed For Pain. 6)   Diclofenac Sodium 75 Mg Tbec (Diclofenac Sodium) .... Take 1 Tablet By Mouth Two Times A Day  Allergies (verified): 1)  ! * Robitussin 2)  ! Penicillin 3)  ! Ampicillin 4)  ! Amoxicillin 5)  ! * Cillins  Past History:  Past Medical History: Last updated: 01/10/2009 1. Left foot pain - most likely secondary to gout.   2. Mild hypertension - not on any medications at home.   3. Polysubstance abuse - alcohol, cocaine, marijuana and tobacco,       cessation consultation provided.   4. Microcytic anemia - stable, baseline Hg 12   Risk Factors: Smoking Status: current (03/21/2010) Packs/Day: 0.5 (03/21/2010)  Review of Systems      See HPI  Physical Exam  General:  uncomfortable due to pain.   Lungs:  normal respiratory effort, no accessory muscle use, normal breath sounds, no crackles, and no wheezes.   Heart:  normal rate, regular rhythm, no murmur, no gallop, and no rub.   Abdomen:  normal bowel sounds.   Msk:  pain with movement of hip.  TTP over greater trochanter R.  right foot tenderness over dorsum.  No redness or swelling noted.  Extremities:  no edema Neurologic:  alert & oriented X3.   Skin:  circular erythematous rash most prominent over right thigh.  much less so on remainder of lower and upper extremities.  Psych:  Oriented X3.     Impression & Recommendations:  Problem # 1:  HIP PAIN, RIGHT (ICD-719.45)  Unclear etiology Have reviewed old work ups for foot, knee, hip pain:  these include ankle aspirate with WBC 200s wthout orgs or crstals, normal sed rate, slightly elevated CRP, normal ANA and RF, negative lyme titers, and negative GC, chlamydia, HIV and hepatitis. Pt has had torn medical miniscus of right knee on MRI as well as cellulitis/myositis or right hand in the past. Will check CBC with diff, ESR, CRP and x ray today Needs to get doumentation for Jaynee Eagles in order to refer to sports medicine. will treat with short supply of vicodin and diclofenac in  the meantime unless something acute on above.  His updated medication list for this problem includes:    Ultracet 37.5-325 Mg Tabs (Tramadol-acetaminophen) .Marland Kitchen... Take 1 - 2 tablets every 6 hours as needed for pain.    Flexeril 5 Mg Tabs (Cyclobenzaprine hcl) .Marland Kitchen... Take 1 tablet by mouth three times day    Percocet 5-325 Mg Tabs (Oxycodone-acetaminophen) .Marland Kitchen... Take 1 tablet by mouth every four hours as needed for pain.    Diclofenac Sodium 75 Mg Tbec (Diclofenac sodium) .Marland Kitchen... Take 1 tablet by mouth two times a day  Orders: T-CBC w/Diff ([id number]) T-CK Total [id number]) T-Drug Screen-Urine, (single) [id number]) T- Sed rate non-auto ([id number]) T-C-Reactive Protein ([id number]) Diagnostic X-Ray/Fluoroscopy (Diagnostic X-Ray/Flu)  Complete Medication List: 1)  Prevacid Solutab 30 Mg Tbdp (Lansoprazole) .... Take 1 tablet by mouth twice a day. 2)  Coreg 6.25 Mg Tabs (Carvedilol) .... Take 1 pill by mouth two times a day 3)  Ultracet 37.5-325 Mg Tabs (Tramadol-acetaminophen) .... Take 1 - 2 tablets every 6 hours as needed for pain. 4)  Flexeril 5 Mg Tabs (Cyclobenzaprine hcl) .... Take 1 tablet by mouth three times day 5)  Percocet 5-325 Mg Tabs (Oxycodone-acetaminophen) .... Take 1 tablet by mouth every four hours as needed for pain. 6)  Diclofenac Sodium 75 Mg Tbec (Diclofenac sodium) .... Take 1 tablet by mouth two times a day  Patient Instructions: 1)  Please schedule a follow-up appointment in 2 weeks. 2)  Need to bring documentation for Jaynee Eagles so that we can refer you to sports medicine. 3)  Will give you percocet and diclofenac as ordered below. 4)  Wil check labs and x ray and call if there is any problem. 5)  if you have any worsening problems, call clinic  Prescriptions: DICLOFENAC SODIUM 75 MG TBEC (DICLOFENAC SODIUM) Take 1 tablet by mouth two times a day  #30 x 0   Entered and Authorized by:   Mariea Stable MD   Signed by:   Mariea Stable MD on 03/21/2010    Method used:   Print then Give to Patient   RxID:   [id number] PERCOCET 5-325 MG TABS (OXYCODONE-ACETAMINOPHEN) Take 1 tablet by mouth every four hours as needed for pain.  #60 x 0   Entered and Authorized by:   Mariea Stable MD   Signed by:   Mariea Stable MD on 03/21/2010   Method used:   Print then Give to Patient   RxID:   [id number]  Process Orders Check Orders Results:     [facility]: ABN not  required for this insurance Tests Sent for requisitioning (March 21, 2010 12:34 PM):     03/21/2010: [facility] -- T-CBC w/Diff [[id number]] (signed)     03/21/2010: [facility] -- T-CK Total [[id number]] (signed)     03/21/2010: [facility] -- T-Drug Screen-Urine, (single) [[id number]] (signed)     03/21/2010: [facility] -- T- Sed rate non-auto [[id number]] (signed)     03/21/2010: [facility] -- T-C-Reactive Protein [phone] (signed)    Prevention & Chronic Care Immunizations   Influenza vaccine: Not documented   Influenza vaccine deferral: Deferred  (04/20/2009)   Influenza vaccine due: 06/21/2009    Tetanus booster: Not documented   Td booster deferral: Deferred  (04/20/2009)    Pneumococcal vaccine: Not documented   Pneumococcal vaccine deferral: Deferred  (04/20/2009)   Pneumococcal vaccine due: 04/20/2014  Other Screening   Smoking status: current  (03/21/2010)   Smoking cessation counseling: yes  (03/21/2010)

## 2010-11-20 NOTE — Progress Notes (Signed)
  Phone Note Other Incoming   Caller: Nance Pew, [place] in ED Summary of Call: Provider above called to discuss pt, He was seen in [facility] ED 9/14, and then went to [facility] ED again today. C/o R ankle and foot pain. Doppler was - for DVT.There was some swelling. wbc 17.3 but has been chronically elevated. There was no reason for him to be admitted.He was given #20 percocet 9/14, and was given prednisone taper today. Hospital D/C and notes reviewed. Appt. made with his new PCP on 07/10/10.  Initial call taken by: Zoila Shutter MD,  July 05, 2010 12:05 PM

## 2010-11-20 NOTE — Progress Notes (Signed)
  Phone Note Outgoing Call   Call placed by: Theotis Barrio NT II,  April 03, 2010 4:40 PM Call placed to: Specialist Details for Reason: [facility] [phone] Summary of Call: CALLED OFFICE TO FOLLOW UP ON REFERRAL , OFFICE SAID THEY CALLED THE PATIENT, WAITING FOR HIM TO CALL THEM BACK TO SET UP THE APPT. / OFFICE SAID NORMALY DO NOT CALL THE REFERRING OFFICE BACK WITH THE APPT. INFO.Lela Sturdivant NT II  April 03, 2010 4:43 PM

## 2010-11-20 NOTE — Assessment & Plan Note (Signed)
Summary: f/u/cfb   Vital Signs:  Patient profile:   30 year old male Temp:     97.0 degrees F Pulse rate:   81 / minute BP sitting:   148 / 105  (right arm) Cuff size:   large  Vitals Entered By: Dorie Rank RN (February 02, 2010 10:55 AM) CC: right knee injury - tried ice and heat and Tramadol - using the support from last right knee injury - has injured right knee in past-been to ED for help in past - keeps being told gout is causing pain  Is Patient Diabetic? No Pain Assessment Patient in pain? yes     Location: right knee Intensity: 10 Type: sharp Onset of pain  1 week Nutritional Status BMI of > 30 = obese  Does patient need assistance? Functional Status Self care Ambulation Impaired:Risk for fall Comments crutches- ran out of all meds about 1 month ago   CC:  right knee injury - tried ice and heat and Tramadol - using the support from last right knee injury - has injured right knee in past-been to ED for help in past - keeps being told gout is causing pain .  History of Present Illness: The patient is a 30 year old male with sickle cell trait, mild essential hypertension and hx of polysubstance abuse (alcohol, marijuana, cocaine and tobacco).  Pt has been experiencing simillar complaints in the past and had multiple ER visits and even hospitalization 2/2 to same issue.  He was empirically treated for Gout in the past using indomethacin and also prednisone tapering, and he had some improvement in the swelling and pain of his joint. Today he is here for followup on his foot pain and to been started on maintenance treatment for his joint pain (?? Gout).  this time he c/o knee R pain swelling and warmth, since this monday has been getting worse. is about 10/10 intesity, sharp/burining/stabbing in quality, radiates up and down the leg,  it has been getting worse over the past few days, it is brought on by his knee giving out when he was trying to sit down on monday, It's  constant. it is alleviated by standing up, aggravated by laying down, associated with fever, sweating, Denies SOB, Denies CP.  Pt is having trouble bearing weight, but currently denies any fever, chills, denies any other joint pain, rashes, dysuria or any other systemic complaints.   (time spent with patient >30 minutes)  Preventive Screening-Counseling & Management  Alcohol-Tobacco     Alcohol drinks/day: 1-2 /week     Alcohol type: beer     Smoking Status: current     Smoking Cessation Counseling: yes     Smoke Cessation Stage: contemplative     Packs/Day: 0.5     Year Started: age 56 yrs. old  Caffeine-Diet-Exercise     Does Patient Exercise: yes     Type of exercise: walking at work  Current Medications (verified): 1)  Prevacid Solutab 30 Mg Tbdp (Lansoprazole) .... Take 1 Tablet By Mouth Twice A Day. 2)  Coreg 6.25 Mg Tabs (Carvedilol) .... Take 1 Pill By Mouth Two Times A Day 3)  Ultracet 37.5-325 Mg Tabs (Tramadol-Acetaminophen) .... Take 1 - 2 Tablets Every 6 Hours As Needed For Pain. 4)  Flexeril 5 Mg Tabs (Cyclobenzaprine Hcl) .... Take 1 Tablet By Mouth Three Times Day 5)  Percocet 5-325 Mg Tabs (Oxycodone-Acetaminophen) .... Take 1 Tablet By Mouth Every Four Hours As Needed For Pain. 6)  Diclofenac Sodium 75  Mg Tbec (Diclofenac Sodium) .... Take 1 Tablet By Mouth Two Times A Day  Allergies (verified): 1)  ! * Robitussin 2)  ! Penicillin 3)  ! Ampicillin 4)  ! Amoxicillin 5)  ! * Cillins  Social History: Packs/Day:  0.5  Review of Systems       Per HPI  Physical Exam  General:  diaphoretic, uncomfortable-appearing, mild distress, and unable to place on exam table.   Lungs:  normal respiratory effort, no accessory muscle use, normal breath sounds, no crackles, and no wheezes.  Heart:  normal rate, regular rhythm, no murmur, no gallop, and no rub.    Abdomen:  soft, non-tender, normal bowel sounds, no distention, no guarding, no rebound tenderness, no  hepatomegaly, and no splenomegaly.    Msk:  R knee swelling, warmth, and redness over joint.   Pulses:  2+ DP/PT pulses bilaterally  Extremities:  No cyanosis, clubbing, edema in R leg around the knee. Neurologic:  No cranial nerve deficits noted. Station and gait are normal. Sensory, motor and coordinative functions appear intact. Psych:  Oriented X3, memory intact for recent and remote, normally interactive, good eye contact, not anxious appearing, and not depressed appearing.    Impression & Recommendations:  Problem # 1:  KNEE PAIN, RIGHT, ACUTE (ICD-719.46) Assessment New Patient c/o knee R pain swelling and warmth, since this monday has been getting worse. is about 10/10 intesity, sharp/burining/stabbing in quality, radiates up and down the leg,  it has been getting worse over the past few days, it is brought on by his knee giving out when he was trying to sit down on monday, It's constant. it is alleviated by standing up, aggravated by laying down, associated with fever, sweating, Denies SOB, Denies CP. the DDx is Mechanical derangement vs infecious vs gout vs rhuematological.  Patient refuses Joint aspiration and he understand the risk of refusal of joint aspiration.  Will order MRI, and for now give patient Diclofenac & Percocet will also check CBC with diff to look for infection and CMET. Will make f/u in 2 weeks. Note patient was given 60mg  im toradol in clinic as he was in mild distress 2/2 to pain.    His updated medication list for this problem includes:    Ultracet 37.5-325 Mg Tabs (Tramadol-acetaminophen) .Marland Kitchen... Take 1 - 2 tablets every 6 hours as needed for pain.    Flexeril 5 Mg Tabs (Cyclobenzaprine hcl) .Marland Kitchen... Take 1 tablet by mouth three times day    Percocet 5-325 Mg Tabs (Oxycodone-acetaminophen) .Marland Kitchen... Take 1 tablet by mouth every four hours as needed for pain.    Diclofenac Sodium 75 Mg Tbec (Diclofenac sodium) .Marland Kitchen... Take 1 tablet by mouth two times a  day  Orders: T-CBC w/Diff ([id number]) T-Comprehensive Metabolic Panel ([id number]) MRI with Contrast (MRI w/Contrast)  Problem # 2:  GOUT (ICD-274.9) Assessment: Comment Only This is probably false diagnosis as serum uric acid level has never neeb more than 7, and we dont not have a joint tap to prove gout.  See problem #1  Problem # 3:  HYPERTENSION NEC (ICD-997.91) Assessment: Comment Only BP elevated today, this maybe 2/2 to pain, patient currently only on Coreg however i dont not think that the patient is taking this. If bp remain elevated will consider alternative BP med. suck as HCTZ.  and due to history of cocaine abuse BB should not be used in this patient.   Problem # 4:  GERD (ICD-530.81) Assessment: Comment Only Well controlled on current treatment,  No new changes made today, Will continue to monitor.   His updated medication list for this problem includes:    Prevacid Solutab 30 Mg Tbdp (Lansoprazole) .Marland Kitchen... Take 1 tablet by mouth twice a day.  Complete Medication List: 1)  Prevacid Solutab 30 Mg Tbdp (Lansoprazole) .... Take 1 tablet by mouth twice a day. 2)  Coreg 6.25 Mg Tabs (Carvedilol) .... Take 1 pill by mouth two times a day 3)  Ultracet 37.5-325 Mg Tabs (Tramadol-acetaminophen) .... Take 1 - 2 tablets every 6 hours as needed for pain. 4)  Flexeril 5 Mg Tabs (Cyclobenzaprine hcl) .... Take 1 tablet by mouth three times day 5)  Percocet 5-325 Mg Tabs (Oxycodone-acetaminophen) .... Take 1 tablet by mouth every four hours as needed for pain. 6)  Diclofenac Sodium 75 Mg Tbec (Diclofenac sodium) .... Take 1 tablet by mouth two times a day  Other Orders: Ketorolac-Toradol 15mg  ([id number]) Admin of Therapeutic Inj  intramuscular or subcutaneous ([id number])  Patient Instructions: 1)  Please schedule a follow-up appointment in 2 weeks. Prescriptions: DICLOFENAC SODIUM 75 MG TBEC (DICLOFENAC SODIUM) Take 1 tablet by mouth two times a day  #30 x 0   Entered and Authorized  by:   Darnelle Maffucci MD   Signed by:   Darnelle Maffucci MD on 02/02/2010   Method used:   Print then Give to Patient   RxID:   [id number] PERCOCET 5-325 MG TABS (OXYCODONE-ACETAMINOPHEN) Take 1 tablet by mouth every four hours as needed for pain.  #60 x 0   Entered and Authorized by:   Darnelle Maffucci MD   Signed by:   Darnelle Maffucci MD on 02/02/2010   Method used:   Print then Give to Patient   RxID:   [id number]  Process Orders Check Orders Results:     [facility]: ABN not required for this insurance Tests Sent for requisitioning (February 02, 2010 1:41 PM):     02/02/2010: [facility] -- T-CBC w/Diff [[id number]] (signed)     02/02/2010: [facility] -- T-Comprehensive Metabolic Panel [phone] (signed)     Medication Administration  Injection # 1:    Medication: Ketorolac-Toradol 15mg     Diagnosis: FOOT PAIN, BILATERAL (ICD-729.5)    Route: IM    Site: R deltoid    Comments: gave tordol 60mg  im for acute pain.     Given by: Darnelle Maffucci MD (February 02, 2010 11:51 AM)  Orders Added: 1)  Ketorolac-Toradol 15mg  [[id number]] 2)  Admin of Therapeutic Inj  intramuscular or subcutaneous [96372] 3)  T-CBC w/Diff [[id number]] 4)  T-Comprehensive Metabolic Panel [[id number]] 5)  MRI with Contrast [MRI w/Contrast] 6)  Est. Patient Level V [51761]  Prevention & Chronic Care Immunizations   Influenza vaccine: Not documented   Influenza vaccine deferral: Deferred  (04/20/2009)   Influenza vaccine due: 06/21/2009    Tetanus booster: Not documented   Td booster deferral: Deferred  (04/20/2009)    Pneumococcal vaccine: Not documented   Pneumococcal vaccine deferral: Deferred  (04/20/2009)   Pneumococcal vaccine due: 04/20/2014  Other Screening   Smoking status: current  (02/02/2010)   Smoking cessation counseling: yes  (02/02/2010)

## 2010-11-28 ENCOUNTER — Encounter: Payer: Self-pay | Admitting: Internal Medicine

## 2011-01-03 LAB — DIFFERENTIAL
Eosinophils Relative: 1 % (ref 0–5)
Lymphocytes Relative: 15 % (ref 12–46)
Lymphs Abs: 2.6 10*3/uL (ref 0.7–4.0)
Monocytes Absolute: 1.1 10*3/uL — ABNORMAL HIGH (ref 0.1–1.0)
Monocytes Relative: 7 % (ref 3–12)

## 2011-01-03 LAB — RAPID URINE DRUG SCREEN, HOSP PERFORMED
Amphetamines: NOT DETECTED
Benzodiazepines: POSITIVE — AB
Cocaine: NOT DETECTED

## 2011-01-03 LAB — CBC
HCT: 41.8 % (ref 39.0–52.0)
Hemoglobin: 13.8 g/dL (ref 13.0–17.0)
MCH: 23.4 pg — ABNORMAL LOW (ref 26.0–34.0)
MCV: 70.6 fL — ABNORMAL LOW (ref 78.0–100.0)
RBC: 5.92 MIL/uL — ABNORMAL HIGH (ref 4.22–5.81)
WBC: 17.3 10*3/uL — ABNORMAL HIGH (ref 4.0–10.5)

## 2011-01-03 LAB — BASIC METABOLIC PANEL
CO2: 26 mEq/L (ref 19–32)
Chloride: 104 mEq/L (ref 96–112)
GFR calc Af Amer: >60 mL/min (ref >60)
Potassium: 3.8 mEq/L (ref 3.5–5.1)

## 2011-01-03 LAB — URINALYSIS, ROUTINE W REFLEX MICROSCOPIC
Glucose, UA: NEGATIVE mg/dL
Hgb urine dipstick: NEGATIVE
pH: 6.5 (ref 5.0–8.0)

## 2011-01-21 LAB — BASIC METABOLIC PANEL
CO2: 27 mEq/L (ref 19–32)
Calcium: 9.5 mg/dL (ref 8.4–10.5)
Creatinine, Ser: 0.91 mg/dL (ref 0.4–1.5)
GFR calc Af Amer: >60 mL/min (ref >60)
Glucose, Bld: 123 mg/dL — ABNORMAL HIGH (ref 70–99)

## 2011-01-21 LAB — CBC
MCHC: 32.8 g/dL (ref 30.0–36.0)
RBC: 5.56 MIL/uL (ref 4.22–5.81)
RDW: 15 % (ref 11.5–15.5)

## 2011-01-22 LAB — BASIC METABOLIC PANEL
Calcium: 9.2 mg/dL (ref 8.4–10.5)
GFR calc Af Amer: >60 mL/min (ref >60)
GFR calc non Af Amer: >60 mL/min (ref >60)
Sodium: 135 mEq/L (ref 135–145)

## 2011-01-22 LAB — DIFFERENTIAL
Eosinophils Absolute: 0.2 10*3/uL (ref 0.0–0.7)
Eosinophils Relative: 1 % (ref 0–5)
Lymphocytes Relative: 25 % (ref 12–46)
Lymphs Abs: 4.1 10*3/uL — ABNORMAL HIGH (ref 0.7–4.0)
Monocytes Relative: 7 % (ref 3–12)

## 2011-01-22 LAB — ANA: Anti Nuclear Antibody(ANA): NEGATIVE

## 2011-01-22 LAB — HEPATIC FUNCTION PANEL
ALT: 24 U/L (ref 0–53)
Albumin: 3.8 g/dL (ref 3.5–5.2)
Alkaline Phosphatase: 55 U/L (ref 39–117)
Total Protein: 6.8 g/dL (ref 6.0–8.3)

## 2011-01-22 LAB — MPO/PR-3 (ANCA) ANTIBODIES

## 2011-01-22 LAB — ANTI-NEUTROPHIL ANTIBODY

## 2011-01-22 LAB — CBC
HCT: 42.1 % (ref 39.0–52.0)
Hemoglobin: 14 g/dL (ref 13.0–17.0)
MCV: 70.4 fL — ABNORMAL LOW (ref 78.0–100.0)
RBC: 5.98 MIL/uL — ABNORMAL HIGH (ref 4.22–5.81)
WBC: 16.3 10*3/uL — ABNORMAL HIGH (ref 4.0–10.5)

## 2011-01-22 LAB — URIC ACID: Uric Acid, Serum: 5.6 mg/dL (ref 4.0–7.8)

## 2011-01-22 LAB — HIV ANTIBODY (ROUTINE TESTING W REFLEX): HIV: NONREACTIVE

## 2011-01-22 LAB — TSH: TSH: 0.152 u[IU]/mL — ABNORMAL LOW (ref 0.350–4.500)

## 2011-01-22 LAB — T4, FREE: Free T4: 1.28 ng/dL (ref 0.80–1.80)

## 2011-01-22 LAB — SEDIMENTATION RATE: Sed Rate: 5 mm/hr (ref 0–16)

## 2011-01-30 LAB — LIPID PANEL
HDL: 45 mg/dL (ref >39)
Total CHOL/HDL Ratio: 3.8 RATIO
Triglycerides: 81 mg/dL (ref <150)
VLDL: 16 mg/dL (ref 0–40)

## 2011-01-30 LAB — CBC
HCT: 40.1 % (ref 39.0–52.0)
Hemoglobin: 13.9 g/dL (ref 13.0–17.0)
MCHC: 32.2 g/dL (ref 30.0–36.0)
MCV: 71.3 fL — ABNORMAL LOW (ref 78.0–100.0)
MCV: 71.7 fL — ABNORMAL LOW (ref 78.0–100.0)
Platelets: 206 10*3/uL (ref 150–400)
RBC: 6.04 MIL/uL — ABNORMAL HIGH (ref 4.22–5.81)
RDW: 15.5 % (ref 11.5–15.5)
RDW: 15.8 % — ABNORMAL HIGH (ref 11.5–15.5)

## 2011-01-30 LAB — THC (MARIJUANA), URINE, CONFIRMATION: Marijuana, Ur-Confirmation: 92 ng/mL

## 2011-01-30 LAB — URINALYSIS, ROUTINE W REFLEX MICROSCOPIC
Bilirubin Urine: NEGATIVE
Glucose, UA: NEGATIVE mg/dL
Hgb urine dipstick: NEGATIVE
Ketones, ur: 15 mg/dL — AB
Protein, ur: NEGATIVE mg/dL

## 2011-01-30 LAB — DIFFERENTIAL
Eosinophils Absolute: 0.2 10*3/uL (ref 0.0–0.7)
Lymphocytes Relative: 29 % (ref 12–46)
Lymphs Abs: 3.5 10*3/uL (ref 0.7–4.0)
Monocytes Relative: 10 % (ref 3–12)
Neutro Abs: 7.3 10*3/uL (ref 1.7–7.7)
Neutrophils Relative %: 60 % (ref 43–77)

## 2011-01-30 LAB — BASIC METABOLIC PANEL
BUN: 11 mg/dL (ref 6–23)
BUN: 8 mg/dL (ref 6–23)
Calcium: 9.3 mg/dL (ref 8.4–10.5)
Creatinine, Ser: 1.13 mg/dL (ref 0.4–1.5)
Creatinine, Ser: 1.25 mg/dL (ref 0.4–1.5)
GFR calc non Af Amer: >60 mL/min (ref >60)
GFR calc non Af Amer: >60 mL/min (ref >60)
Glucose, Bld: 89 mg/dL (ref 70–99)
Potassium: 4 mEq/L (ref 3.5–5.1)

## 2011-01-30 LAB — COMPREHENSIVE METABOLIC PANEL
CO2: 24 mEq/L (ref 19–32)
Calcium: 9.3 mg/dL (ref 8.4–10.5)
Creatinine, Ser: 1.06 mg/dL (ref 0.4–1.5)
GFR calc non Af Amer: >60 mL/min (ref >60)
Glucose, Bld: 99 mg/dL (ref 70–99)
Total Protein: 6.6 g/dL (ref 6.0–8.3)

## 2011-01-30 LAB — MAGNESIUM: Magnesium: 2.3 mg/dL (ref 1.5–2.5)

## 2011-01-30 LAB — HEMOGLOBIN A1C: Hgb A1c MFr Bld: 5.8 % (ref 4.6–6.1)

## 2011-01-30 LAB — URINE DRUGS OF ABUSE SCREEN W ALC, ROUTINE (REF LAB)
Amphetamine Screen, Ur: NEGATIVE
Barbiturate Quant, Ur: NEGATIVE
Benzodiazepines.: NEGATIVE
Ethyl Alcohol: <10 mg/dL (ref <10)
Methadone: NEGATIVE
Propoxyphene: NEGATIVE

## 2011-01-30 LAB — CARDIAC PANEL(CRET KIN+CKTOT+MB+TROPI)
CK, MB: 4.5 ng/mL — ABNORMAL HIGH (ref 0.3–4.0)
Relative Index: 0.7 (ref 0.0–2.5)
Relative Index: 0.7 (ref 0.0–2.5)
Relative Index: 0.8 (ref 0.0–2.5)
Troponin I: <0.01 ng/mL (ref 0.00–0.06)

## 2011-01-30 LAB — PROTIME-INR
INR: 1 (ref 0.00–1.49)
Prothrombin Time: 13.2 seconds (ref 11.6–15.2)

## 2011-01-30 LAB — LIPASE, BLOOD: Lipase: 23 U/L (ref 11–59)

## 2011-01-30 LAB — APTT: aPTT: 30 seconds (ref 24–37)

## 2011-01-31 LAB — RAPID URINE DRUG SCREEN, HOSP PERFORMED
Amphetamines: NOT DETECTED
Barbiturates: NOT DETECTED
Benzodiazepines: NOT DETECTED
Tetrahydrocannabinol: POSITIVE — AB

## 2011-01-31 LAB — BASIC METABOLIC PANEL
Calcium: 9.5 mg/dL (ref 8.4–10.5)
GFR calc Af Amer: >60 mL/min (ref >60)
GFR calc non Af Amer: >60 mL/min (ref >60)
Glucose, Bld: 94 mg/dL (ref 70–99)
Sodium: 138 mEq/L (ref 135–145)

## 2011-01-31 LAB — DIFFERENTIAL
Basophils Absolute: 0.1 10*3/uL (ref 0.0–0.1)
Basophils Relative: 0 % (ref 0–1)
Eosinophils Absolute: 0.2 10*3/uL (ref 0.0–0.7)
Eosinophils Relative: 2 % (ref 0–5)
Lymphocytes Relative: 25 % (ref 12–46)
Lymphs Abs: 2.1 10*3/uL (ref 0.7–4.0)
Lymphs Abs: 3.3 10*3/uL (ref 0.7–4.0)
Monocytes Absolute: 1 10*3/uL (ref 0.1–1.0)
Monocytes Absolute: 1.1 10*3/uL — ABNORMAL HIGH (ref 0.1–1.0)
Monocytes Relative: 9 % (ref 3–12)
Monocytes Relative: 8 % (ref 3–12)
Neutro Abs: 7.6 10*3/uL (ref 1.7–7.7)

## 2011-01-31 LAB — COMPREHENSIVE METABOLIC PANEL
ALT: 20 U/L (ref 0–53)
AST: 28 U/L (ref 0–37)
Albumin: 3.8 g/dL (ref 3.5–5.2)
Albumin: 3.9 g/dL (ref 3.5–5.2)
Alkaline Phosphatase: 51 U/L (ref 39–117)
CO2: 25 mEq/L (ref 19–32)
Calcium: 9.2 mg/dL (ref 8.4–10.5)
Chloride: 106 mEq/L (ref 96–112)
Creatinine, Ser: 1.13 mg/dL (ref 0.4–1.5)
Glucose, Bld: 110 mg/dL — ABNORMAL HIGH (ref 70–99)
Potassium: 3.8 mEq/L (ref 3.5–5.1)
Potassium: 3.6 mEq/L (ref 3.5–5.1)
Sodium: 138 mEq/L (ref 135–145)
Sodium: 139 mEq/L (ref 135–145)
Total Protein: 6 g/dL (ref 6.0–8.3)
Total Protein: 6.5 g/dL (ref 6.0–8.3)

## 2011-01-31 LAB — POCT I-STAT, CHEM 8
BUN: 13 mg/dL (ref 6–23)
Calcium, Ion: 1.07 mmol/L — ABNORMAL LOW (ref 1.12–1.32)
Chloride: 106 mEq/L (ref 96–112)
Creatinine, Ser: 1.4 mg/dL (ref 0.4–1.5)
Glucose, Bld: 108 mg/dL — ABNORMAL HIGH (ref 70–99)

## 2011-01-31 LAB — CBC
HCT: 39.7 % (ref 39.0–52.0)
Hemoglobin: 13.2 g/dL (ref 13.0–17.0)
Hemoglobin: 13.4 g/dL (ref 13.0–17.0)
MCV: 70.8 fL — ABNORMAL LOW (ref 78.0–100.0)
Platelets: 224 10*3/uL (ref 150–400)
Platelets: 223 10*3/uL (ref 150–400)
RDW: 15.6 % — ABNORMAL HIGH (ref 11.5–15.5)
RDW: 15.5 % (ref 11.5–15.5)
WBC: 13 10*3/uL — ABNORMAL HIGH (ref 4.0–10.5)

## 2011-02-05 LAB — URINALYSIS, ROUTINE W REFLEX MICROSCOPIC
Nitrite: NEGATIVE
Specific Gravity, Urine: 1.011 (ref 1.005–1.030)
pH: 6.5 (ref 5.0–8.0)

## 2011-02-05 LAB — CBC
Hemoglobin: 12.5 g/dL — ABNORMAL LOW (ref 13.0–17.0)
Hemoglobin: 13.3 g/dL (ref 13.0–17.0)
Hemoglobin: 13.4 g/dL (ref 13.0–17.0)
MCHC: 32 g/dL (ref 30.0–36.0)
MCHC: 32.5 g/dL (ref 30.0–36.0)
MCHC: 32.7 g/dL (ref 30.0–36.0)
Platelets: 202 10*3/uL (ref 150–400)
RBC: 5.42 MIL/uL (ref 4.22–5.81)
RBC: 5.72 MIL/uL (ref 4.22–5.81)
RDW: 15.1 % (ref 11.5–15.5)
WBC: 13 10*3/uL — ABNORMAL HIGH (ref 4.0–10.5)

## 2011-02-05 LAB — MAGNESIUM: Magnesium: 2.3 mg/dL (ref 1.5–2.5)

## 2011-02-05 LAB — ANTISTREPTOLYSIN O TITER: ASO: 43 IU/mL (ref 0–116)

## 2011-02-05 LAB — HEPATIC FUNCTION PANEL
AST: 24 U/L (ref 0–37)
Albumin: 4 g/dL (ref 3.5–5.2)
Alkaline Phosphatase: 49 U/L (ref 39–117)
Total Bilirubin: 0.7 mg/dL (ref 0.3–1.2)
Total Protein: 6.7 g/dL (ref 6.0–8.3)

## 2011-02-05 LAB — HEPATITIS C ANTIBODY: HCV Ab: NEGATIVE

## 2011-02-05 LAB — COMPREHENSIVE METABOLIC PANEL
ALT: 16 U/L (ref 0–53)
Alkaline Phosphatase: 48 U/L (ref 39–117)
CO2: 27 mEq/L (ref 19–32)
Calcium: 8.8 mg/dL (ref 8.4–10.5)
Chloride: 105 mEq/L (ref 96–112)
GFR calc non Af Amer: >60 mL/min (ref >60)
Glucose, Bld: 112 mg/dL — ABNORMAL HIGH (ref 70–99)
Sodium: 136 mEq/L (ref 135–145)
Total Bilirubin: 0.8 mg/dL (ref 0.3–1.2)

## 2011-02-05 LAB — TROPONIN I: Troponin I: 0.01 ng/mL (ref 0.00–0.06)

## 2011-02-05 LAB — RAPID URINE DRUG SCREEN, HOSP PERFORMED: Benzodiazepines: NOT DETECTED

## 2011-02-05 LAB — URIC ACID
Uric Acid, Serum: 5.3 mg/dL (ref 4.0–7.8)
Uric Acid, Serum: 5.7 mg/dL (ref 4.0–7.8)

## 2011-02-05 LAB — BASIC METABOLIC PANEL
CO2: 25 mEq/L (ref 19–32)
Calcium: 9 mg/dL (ref 8.4–10.5)
GFR calc Af Amer: >60 mL/min (ref >60)
Sodium: 137 mEq/L (ref 135–145)

## 2011-02-05 LAB — MYOGLOBIN, SERUM: Myoglobin: 45 ng/mL (ref <111)

## 2011-02-05 LAB — CULTURE, BLOOD (ROUTINE X 2): Culture: NO GROWTH

## 2011-02-05 LAB — CK: Total CK: 428 U/L — ABNORMAL HIGH (ref 7–232)

## 2011-02-05 LAB — GC/CHLAMYDIA PROBE AMP, URINE
Chlamydia, Swab/Urine, PCR: NEGATIVE
GC Probe Amp, Urine: NEGATIVE

## 2011-02-05 LAB — HEMOGLOBINOPATHY EVALUATION: Hgb A: 69.6 % — ABNORMAL LOW (ref 96.8–97.8)

## 2011-02-05 LAB — CK TOTAL AND CKMB (NOT AT ARMC)
CK, MB: 6.2 ng/mL — ABNORMAL HIGH (ref 0.3–4.0)
Relative Index: 1 (ref 0.0–2.5)

## 2011-02-05 LAB — DIFFERENTIAL
Lymphocytes Relative: 16 % (ref 12–46)
Monocytes Absolute: 1 10*3/uL (ref 0.1–1.0)
Monocytes Relative: 6 % (ref 3–12)
Neutro Abs: 13.5 10*3/uL — ABNORMAL HIGH (ref 1.7–7.7)

## 2011-03-05 NOTE — Discharge Summary (Signed)
[name], [name]               ACCOUNT NO.:  [id number]   MEDICAL RECORD NO.:  [id number]          PATIENT TYPE:  OBV   LOCATION:  5016                         FACILITY:  [facility]   PHYSICIAN:  Manning Charity, MD     DATE OF BIRTH:  1981-02-16   DATE OF ADMISSION:  08/14/2008  DATE OF DISCHARGE:  08/15/2008                               DISCHARGE SUMMARY   DISCHARGE DIAGNOSES:  1. Left ankle cellulitis.  2. Leukocytosis.  3. Tobacco abuse.  4. Alcohol abuse.  5. Hypertension.   DISCHARGE MEDICATIONS:  Bactrim Double Strength 2 tablets by mouth twice  a day.  Once the medications are gone, medications were provided by the  hospital in order to help with the patient's treatment.  1. Ibuprofen 800 mg 3 times a day for 1 week.  2. Nicotine patch 14 mg changing each pack every 24 hours.  3. The patient was also instructed to take multivitamin by mouth      daily.   DISPOSITION AND FOLLOWUP:  The patient had been discharged in a stable  condition with a hospital followup at [facility] on  September 07, 2008, at 2 p.m. by Dr. Elby Showers.  During that  appointment, it is going to be important to check CBC in order to follow  complete resolution of the patient's infection and also to monitorize  the patient's hemoglobin.  It is going to be important to address  tobacco and alcohol consumption and to arrange visit with a Arts development officer, Lupita Leash __________ in case that the patient need further  counseling.   PROCEDURES PERFORMED DURING THIS ADMISSION:  The patient had left foot x-  rayed essentially normal.  The patient also have an MRI that showed soft  tissue erythema consistently with cellulitis.  No fractures or  osteomyelitis were seen.  The patient had joint aspiration, which  demonstrated a synovial fluid with white blood cell count of 279, 3  neutrophils, 47 lymphocytes, no crystals, pink and hazy in color.  The  patient had a second tap performed by  Interventional Radiologist and it  was completely dry tap, nothing came out in the second time under  fluoroscopy.  No other procedures were performed during this admission.   Regarding consultations, Dr. Dorene Grebe was consulted, orthopedic, in  order to take a look of the patient's joint and determine further  treatment and evaluation.  No other consultations were made during this  admission.   HISTORY OF PRESENT ILLNESS/VITAL SIGNS/LABORATORIES AT THE MOMENT OF  ADMISSION:  [name] is a 30 year old male with past medical history  of tobacco and alcohol abuse, sickle cell disease, who came to the  emergency department 2 days ago complaining of foot pain, received pain  medications and prescription for antibiotics.  The patient was unable to  buy medications secondary to financial difficulties and returned 1 day  later with similar complaints, associated pain and edema of his left  ankle.  The patient reports everything started approximately 4 days  prior to the admission.  The patient endorses that the pain had an  intensity of 8/10 localized, nonradiated.  Denies any trauma on the  joint associated with erythema and swelling of the area.  The patient  denies fever, chills, any tick exposure, sick contacts, IV drugs, or sex  outside his relationship.  The patient is using crutches in order to  ambulate due to the pain and incapacity to bear weight.   At the moment of admission, the patient's vital signs were temperature  98.1, blood pressure 138/97, heart rate 75, respiratory rate 18, and  oxygen saturation 96% on room air.   Sodium 140, potassium 4.6, chloride 106, bicarb 26, BUN 11, creatinine  1.3, and blood sugar 106.  White blood cells 2.8, hemoglobin 13.9,  platelet 230.  Erythrocyte sedimentation rate was 0, protein C-reactive  was 0.1, uric acid was 6.  The patient had a complete normal liver  function panel.  HIV was nonreactive.   HOSPITAL COURSE:  1. Left ankle  pain secondary to cellulitis.  Plan is to provide      antibiotics through the hospital using indigent funds.  The patient      to take antibiotics for 1 week and use ibuprofen 3 times a day,      also followup in 1 week in order to help with inflammation and pain      control.  During this admission, gonococcal chlamydia was done and      it was negative.  Joint aspiration was unrevealing with culture      negative until the moment of discharge.  Final report was pending      and he is going to be followed by Dr. Clent Ridges for followup      appointment at [facility] on the      September 07, 2008.  The patient was informed to lose weight in      order to help with the pain secondary to weight bear on his left      arm, antibiotic choice for treatment was Bactrim double strength 2      tablets twice a day.  2. Leukocytosis.  It is steadily improving after treatment was      initiated.  The lab abnormality is going to be followed as an      outpatient during his hospital followup appointment with a CBC.      The patient is instructed to continue using antibiotics for a whole      week.  3. Hypertension.  Blood pressure was elevated during this admission in      the range of 140/97.  The patient is not taking any medications to      control his blood pressure as an outpatient.  During this acute      hospitalization, his elevated blood pressure could be secondary to      pain and also secondary to alcoholication.  No medication is going      to be provided or prescribed at this point.  The patient is going      to be followed as an outpatient and at that moment, blood pressure      is going to be reevaluated and in case that he needs any      medication, we will start him on hydrochlorothiazide or an ACE      inhibitor.  4. Tobacco abuse.  The patient received a formal smoking cessation      counseling by social worker inside the hospital.  Material and  information for outpatient support group was provided and also      prescription for him to use nicotine patch 14 mg daily in order to      help him quit.  5. Alcohol abuse.  The patient also received counseling regarding his      alcohol consumption, the patient received information regarding AA      contact numbers in order to follow with the maintenance provided      for that institution.  The patient demonstrated interest to quit.      On his followup appointment, we will readdress those problem,      provided outpatient counseling if needed, and medication to help      him quit.   At the moment of discharge, the patient's vital signs were temperature  98.7, blood pressure 131/86, heart rate 75, respiratory rate 20, oxygen  saturation 98% on room air.   White blood cells 11.6, hemoglobin 12.7, and platelets 215.  Sodium 136,  potassium 3.9, chloride 104, bicarb 28, BUN 7, creatinine 1.0, and blood  sugar 96.       Rosanna Randy, MD  Electronically Signed      Manning Charity, MD  Electronically Signed    CEM/MEDQ  D:  09/05/2008  T:  09/06/2008  Job:  [id number]   cc:   Elby Showers, MD

## 2011-03-05 NOTE — Consult Note (Signed)
NAMECLAUDE, [name]               ACCOUNT NO.:  [id number]   MEDICAL RECORD NO.:  [id number]          PATIENT TYPE:  OBV   LOCATION:  5016                         FACILITY:  [facility]   PHYSICIAN:  Burnard Bunting, M.D.    DATE OF BIRTH:  02/16/1981   DATE OF CONSULTATION:  DATE OF DISCHARGE:                                 CONSULTATION   CHIEF COMPLAINT:  Left ankle pain.   HISTORY OF PRESENT ILLNESS:  [name] is a 30 year old patient with  2- to 3-day history of acute onset and atraumatic left ankle pain.  He  denies any fevers, but reports occasional chills.  He has significant  difficulty weightbearing.  He was evaluated 2 days ago in the emergency  room.  Uric acid by report was drawn and was normal.  He was prescribed  antibiotics and Vicodin, which he did not fill due to financial  problems.   PAST MEDICAL HISTORY:  Notable for sickle trait, obesity, and tenonitis.   PAST SURGICAL HISTORY:  Negative.   MEDICATIONS:  No current medications.   ALLERGIES:  He is allergic to penicillin.   SOCIAL HISTORY:  The patient is a occasional drinker and smoker.  He  denies any IV drug abuse.  Currently unemployed.   REVIEW OF SYSTEMS:  All systems are reviewed.  Negative for fevers.  No  other joint pains.  All other systems reviewed are negative.   PHYSICAL EXAMINATION:  On exam, he is well developed, well nourished,  has increased body mass index.  He is in mild distress from pain.  VITAL SIGNS:  Blood pressure 130/97, pulse 75, and respirations 18.  He  has some left ankle edema.  Mild medial erythema.  His range of motion  is quite tender.  Compartments are otherwise soft.  There is no crepitus  in the tissues.  There is some mild edema and swelling.  Compartments  are otherwise soft in the leg and foot.   Sed rate is 0.  White count is 12,000.  Radiographs are unremarkable .  The patient did go to fluoroscopy where an ankle tap was performed, it  was dry tap.  Another  previous attempted ankle aspiration was sent;  however, it is unclear whether or not that actually was in the joint.   IMPRESSION:  Left ankle pain and swelling with a white count, but normal  sed rate.   PLAN:  This did not appear to be coming from intra-articular processes  in the joint.  He is currently on IV antibiotics, which is appropriate.  He needs an MRI scan to rule out any type of occult abscess in this  region.  I will see.      Burnard Bunting, M.D.  Electronically Signed     GSD/MEDQ  D:  08/14/2008  T:  08/15/2008  Job:  [id number]

## 2011-03-05 NOTE — Consult Note (Signed)
[name], [name]               ACCOUNT NO.:  [id number]   MEDICAL RECORD NO.:  [id number]          PATIENT TYPE:  INP   LOCATION:  1314                         FACILITY:  [facility]   PHYSICIAN:  Dionne Ano. Gramig, M.D.DATE OF BIRTH:  Sep 05, 1981   DATE OF CONSULTATION:  11/27/2008  DATE OF DISCHARGE:                                 CONSULTATION   REASON FOR CONSULTATION:  [name] is a pleasant 30 year old  male whom I was asked to see by [facility] in regards to his upper  extremity predicament.  The patient describes a 3-day history of  progressive pain in his right hand.  He states 3 days ago he felt some  tendinitis changes, and this has continued to be intermittently  problematic.  The patient had worsening pain over the last 3 days, which  accumulated in a visit to the emergency room at [facility]  today.  The patient presented to the ER with exquisite pain in his right  hand.  He has been seen and  very appropriately evaluated [facility].  They asked me to look at him given the severe pain for comments as to  origin, etc.   The patient denies a history of gout.  He denies a history of  inflammatory arthritides. He denies a history of diabetes, sickle cell  disease, thyroid disease or other significant past medical history for  the most part.  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  He has had one episode of  tendinitis in his leg and left hand in the past, which resolved with  time and does not attribute a specific diagnosis to the condition other  than tendinitis. The patient has not taken any medicines for his  condition at present time.  He denies any locking, popping, catching, or  numbness and tingling in the fingers.  He denies specific trauma to the  region.  He does not note any fever or chills.   PAST MEDICAL HISTORY:  None.   PAST SURGICAL HISTORY:  None.   MEDICATIONS:  None.   ALLERGIES:  PENICILLIN and AMOXICILLIN, which he specifically points out  in the penicillin family which cause swelling.  He is also allergic to  [facility].   SOCIAL HISTORY:  He is married.  He smokes 1/2 pack a day and describes  occasional alcohol use.  He denies illicit drug use.  He denies recent  travel, unusual exposures, etc. I have reviewed this with him at length.   PHYSICAL EXAMINATION:  GENERAL APPEARANCE:  On examination he is a  pleasant male, alert and oriented in no acute distress.  VITAL SIGNS:  He is afebrile.  His temperature was 97.8 on admission.  Follow-up temperature was 98.8.  The patient has a normal lower extremity examination. He denies  abdominal pain, shortness of breath, chest pain and does not have any  evidence of HEENT abnormality on cursory exam.  The patient's left upper  extremity has IV access and is completely nontender.  His right upper  extremity is  guarded in general but examined nonetheless.  Shoulder, elbow upper arm, forearm, and hand are all soft to palpation  without evidence of compartment syndrome.  Thenar, hypothenar and mid  palmar space are free from abnormality.  There is no erythema or  advancing cellulitis.  There are no outward signs of a horrific  infection in my opinion, as I do not feel any warmth, nor do I see any  redness.  He is guarded with wrist and finger range of motion but  certainly does not demonstrate any evidence of flexor or extensor  tenosynovitis.  There is not a specific area of point tenderness  overlying the tendon apparatus of his FCU, FCR, or dorsal extensor  tendons.  There is no evidence of pain on fascial palpation of the  forearm and upper arm.  With gentle motion and relaxation his wrist is  not overly tender.  His hand is warm. He has intact capillary refill and  no evidence of advanced vascular compromise at the present time.  Bony  stability is intact to my exam.  There is no evidence of advanced   peripheral nerve compression.  He is sensate in the hand.   In general this is a hand that has normal contours. No excessive warmth,  erythema, changing compartments compared to the opposite side, or  obvious deformity.  He holds the hand in a guarded position and is  guarded for the examination, but I do not see a gross functional  deficit.  Specifically looking for abscess calcific tendinitis,  compartment syndrome, advancing cellulitis or a significant infection I  do not see any evidence of this at this time to an alarming degree in my  opinion.  His wrist does not have obvious signs of sepsis, as there is  no erythema, although he is generally guarded about the hand.   His white blood cell count is 17.5. His hematocrits 40.9. His potassium  is 4.  Sed rate is 1, which is certainly within normal limits.  His CK  is high at 723.  X-rays are pending.   IMPRESSION:  Right hand pain, etiology undetermined at this point.  However, I did not feel that he has an abscess, compartment syndrome,  cellulitis, significantly advancing infection, or gross bony year  ligamentous derangement at this juncture.   RECOMMENDATIONS:  I have discussed with [facility] hospital specialist  his predicament.  At the present time I would certainly agree with the  treatment plan of admission and close observation.  I have discussed the  differential diagnosis including gout, inflammatory arthritides, or a  vasculitic type event.  I do not see a specific indication for surgical  endeavors at this juncture, but will follow along with this gentleman  closely.  Occasionally gouty arthritis, calcific tendinitis, vasculitis,  and other issues can present in a somewhat similar fashion.  Certainly I  would agree with antibiotic coverage to be on the safe side and would  consider Indocin as an anti-inflammatory and monitor his GI system for  any side effects.  We have discussed the potential role of steroids in  his  treatment, pending his evaluation after 12-24 hours, etc.  Once  again he is an interesting gentleman in that his pain is significant.  However, there is a paucity of findings other than this severe pain.  I  do not see an advancing infection, compartment syndrome, necrotizing  fasciitis, or other a warming entity.  We will await the x-rays and  monitor  his medical status during his hospital stay.  It was a pleasure  to see this interesting patient.  We did discuss the role of  rheumatology referral pending his further lab results, which have been  ordered including inflammatory arthritides panel and other serologies.      Dionne Ano. Amanda Pea, M.D.  Electronically Signed     WMG/MEDQ  D:  11/27/2008  T:  11/27/2008  Job:  [id number]   cc:   Dionne Ano. Amanda Pea, M.D.  Fax: [phone]   Allena Katz, MD

## 2011-03-05 NOTE — Discharge Summary (Signed)
[name], FOK NO.:  [id number]   MEDICAL RECORD NO.:  [id number]          PATIENT TYPE:  OBV   LOCATION:  5004                         FACILITY:  [facility]   PHYSICIAN:  Alvester Morin, M.D.  DATE OF BIRTH:  1980-10-22   DATE OF ADMISSION:  12/24/2008  DATE OF DISCHARGE:                               DISCHARGE SUMMARY   Attending Physician: Dr. Harriett Sine Phifer   DISCHARGE DIAGNOSES:  1. Left foot pain - most likely secondary to gout, pain improving on      discharge.  2. Mild hypertension - not on any medications at home.  3. Polysubstance abuse - alcohol, cocaine, marijuana and tobacco,      cessation consultation provided.  4. Microcytic anemia - stable, baseline Hg 12   DISCHARGE MEDICATIONS:  1. Indomethacin 25 mg take 1 tablet every 8 hours for 2 weeks.  2. Percocet 5/325 mg tablet take 1 tablet every 4 to 6 hours as needed      (for 7 days).   DISPOSITION AND FOLLOWUP:  The patient was discharged from the unit in  stable condition with pain in the left toe and left foot improving  overall.  He will follow up in the outpatient clinic next week.  We will  call the patient for the exact appointment schedule.  We will also  provide crutches on discharge.  On follow up appointment, please assess  if pain in left foot and toe is improving and check CBC since on  discharge and admission the patient had isolated leukocytosis of 13.   CONSULTATIONS:  None.   PROCEDURES:  12/24/2008:  X-ray left foot - no evidence of  fracture/dislocation/ arthropathy, soft tissue is unremarkable.   HISTORY OF PRESENT ILLNESS:  The patient is a 30 year old male with  sickle cell trait, hypertension, polysubstance abuse (alcohol,  marijuana, cocaine and tobacco) with history of left ankle cellulitis  presented to the ED with main concern of left foot pain that has been  getting progressively worse and started the Monday prior to admission.  He reports being in his usual  state of health.  He has been going to  work and suddenly was unable to bear weight secondary to pain.  He  reports similar episodes in the past, with pain never perfectly  controlled.  He reports chills, no fevers, no significant systemic  complaints.  No nausea or vomiting.  No diarrhea or constipation.  No  abdominal or urinary concerns.  Denies trauma to the foot.  Maintains  good appetite.   PHYSICAL EXAMINATION:  VITALS:  T = 98.6, BP = 133/84, R = 16, P = 79, Sat = 97% on room air.  GENERAL:  Lying in bed, not in acute distress.  HEENT:  PERRLA, EOMI.  Nonicteric sclerae.  No oropharyngeal erythema.  Moist mucous membranes.  No thrush.  NECK:  Supple, no stiffness.  Full range of motion.  CARDIOVASCULAR:  Regular rate and rhythm, S1 and S2.  No murmurs.  LUNGS:  Clear to auscultation bilaterally.  No wheezes, rhonchi or  rales.  ABDOMEN:  Soft, nontender,  nondistended.  Bowel sounds present.  EXTREMITIES:  Swelling and tenderness to palpation on the dorsum of the  left foot, no erythema, pulses +2 bilaterally.  NEUROLOGIC:  Alert, oriented x3.  No focal neurologic deficit.  Unable  to assess strength in left foot due to pain.  Otherwise, strength 5/5 in  the rest of the muscle groups.  Sensation intact to soft touch  throughout.   LABS:  Na = 138, K = 3.5, Cl = 106, HCO3 = 23, BUN = 13, Cr = 1.4, Glu =  108  CK = 617, ESR = 0  WBC = 13, ANC = 8.3, Hg = 13.2, MCV = 70.8, Plt = 223.   PROBLEMS:  1. Left foot pain - in patient with prior history of similar pain and      cocaine abuse,  initial concern was for possible self-inflicted      injury.  UDS was negative for cocaine.  Symptoms of left toe pain,      unable to bear weight and unable to put sheet over the foot      consistent with acute gout.  ESR is 0 and the CK 617 (patient has      history of even higher CK values).  X-ray of the foot is clear with      no acute abnormalities.  The patient's pain was well  controlled      overnight and by the next morning patient has clinically improved      with less pain.  He will be discharged on Indomethacin and Percocet      for pain control and will follow up in the outpatient clinic for      further management.  In addition, during the admission, the patient      had leukocytosis with white blood cells of 13 likely related to      demargination and stress from the pain.  The patient remained      afebrile during the hospitalization and will follow up in [facility].  2. Polysubstance abuse - cessation consult provided.  UDS positive for      THC and opiates, not cocaine.  3. Mild hypertension - not on any medications at this time.  Will      follow up in outpatient clinic.   VITALS ON DISCHARGE:  T = 97.4, Bp = 130/91, P = 67, R = 18, Sat = 96%  on room air.   LABS:  Na = 138, K = 4.2, Cl = 104, HCO3 = 30, BUN = 7, Cr = 1.18, Glu =  94.  LFTs within normal limits.  Calcium 9.2.  WBC = 13.7, Hg = 13.4, Plt = 230.   Over 30 minutes spent on discharge.      Mliss Sax, MD  Electronically Signed      Alvester Morin, M.D.  Electronically Signed    IM/MEDQ  D:  12/25/2008  T:  12/25/2008  Job:  [id number]   cc:   Alvester Morin, M.D.

## 2011-03-05 NOTE — Discharge Summary (Signed)
[name], [name]               ACCOUNT NO.:  [id number]   MEDICAL RECORD NO.:  [id number]          PATIENT TYPE:  INP   LOCATION:  1314                         FACILITY:  [facility]   PHYSICIAN:  Hillery Aldo, M.D.   DATE OF BIRTH:  19-Mar-1981   DATE OF ADMISSION:  11/27/2008  DATE OF DISCHARGE:  11/30/2008                               DISCHARGE SUMMARY   PRIMARY CARE PHYSICIAN:  None.   ORTHOPEDIC SURGEON:  Dionne Ano. Amanda Pea, M.D.   DISCHARGE DIAGNOSES:  1. Cellulitis/myositis of the right hand.  2. Rhabdomyolysis.  3. Sickle cell trait.  4. Mild hypertension.  5. Polysubstance abuse including cocaine and marijuana.   DISCHARGE MEDICATIONS:  1. Keflex 500 mg q.6 h. x10 days.  2. Bactrim DS 1 tablet q.12 h. x10 days.  3. Indocin 25 mg q.8 h. x2 weeks.   CONSULTATIONS:  Dr. Dominica Severin of orthopedic surgery.   BRIEF ADMISSION HPI:  The patient is a 30 year old male who presented to  the hospital with a chief complaint of right hand swelling and pain.  The pain was progressive for 3 days prior to presentation.  He  ultimately presented to the emergency department and due to concerns for  cellulitis, was admitted for further evaluation and treatment.  For the  full details, please see the dictated report done by Dr. Allena Katz.   PROCEDURES AND DIAGNOSTIC STUDIES:  1. Chest x-ray on November 27, 2008 showed no acute cardiopulmonary      process.  2. Right wrist films on November 28, 2008 showed no acute or      significant findings.  No evidence for osteomyelitis.  3. Right hand films on November 28, 2008 showed soft tissue swelling      with no focal bony abnormality.  4. Venous Doppler studies of the right upper extremity on November 28, 2008 showed no evidence of deep venous thrombosis.  5. MRI scan of the right hand on November 28, 2008 showed cellulitis      and myositis of the hand, particularly the radial side.  Rim-      enhancing fluid collection in the deep soft  tissues of the hand      along the first and second metacarpals; differential includes      ganglion cyst or abscess.  Negative for osteomyelitis.   DISCHARGE LABORATORY VALUES:  Myoglobin was 45.  Blood cultures were  negative.  Urine drug screen was positive for cocaine, opiates, and THC.  CK was 428.  White blood cell count was 12.8, hemoglobin 13.3,  hematocrit 40.9, platelets 202.  Hepatitis C antibody was negative.  ANA  was negative.  Comprehensive metabolic panel was completely normal with  the exception of mildly elevated glucose at 112.   HOSPITAL COURSE:  Problem #1.  Cellulitis/myositis of the right hand and  wrist:  The patient was admitted and a diagnostic evaluation was  undertaken with the findings as noted above.  There was no evidence of  an underlying rheumatologic disease and because of significant soft  tissue swelling, an MRI scan was ultimately  obtained.  The patient was  seen in consultation with Dr. Amanda Pea of hand surgery.  He was  empirically put on Rocephin and the soft tissue pain and swelling  steadily improved over the course of his hospitalization.  It was  ultimately felt that the patient likely had a muscle injury with  consequent rhabdomyolysis after sleeping on it, possibly due to  underlying intoxication with multiple substances.  The patient's  presentation was not consistent with abscess given his dramatic  improvement over time.  At this point, the soft tissue swelling has  almost completely resolved and his range of motion has improved  dramatically.  It was ultimately felt that the rhabdomyolysis was due to  pressure phenomenon and resultant cellulitis/myositis.  The plan is to  discharge him on a 10-day course of Keflex and Bactrim as well as a 2-  week course of Indocin.  He has followup scheduled with Dr. Amanda Pea on  Monday, December 05, 2008 at 11 a.m.  The patient is instructed to avoid  cocaine and to use a heating pad for comfort  purposes.  Problem #2.  Rhabdomyolysis:  The patient had an elevated CK which has  steadily declined over time.  He is instructed to increase his fluid  intake to 6-8 glasses of water/fluids a day for 1 week's time.  Problem #3.  Sickle cell trait:  The patient has a history of sickle  cell trait.  He did have a hemoglobin evaluation done which did show an  elevated hemoglobin A2 and hemoglobin S levels as well as reduced  hemoglobin A levels.  This is most consistent with sickle cell trait and  possible underlying beta thalassemia trait.  The patient's hemoglobin  remained stable throughout the course of his hospital stay.  Problem #4.  Mild hypertension.  The patient did have some documented  elevated blood pressures.  This may have been due to recent cocaine  intoxication.  His blood pressure is normal at discharge at 138/83.  He  is advised to consume a low-salt diet and to avoid cocaine.  Problem #5.  Polysubstance abuse.  The patient did have a urine drug  screen, with findings noted above.  He was counseled about the  importance of avoidance of cocaine for health purposes.  We will have  the social worker refer him to [facility] for further counseling if he is  agreeable.   DISPOSITION:  The patient is medically stable and will be discharged  home.  Hospital followup with Dr. Amanda Pea has been scheduled.   Time spent coordinating care for discharge and discharge instructions  equals 35 minutes.      Hillery Aldo, M.D.  Electronically Signed     CR/MEDQ  D:  11/30/2008  T:  11/30/2008  Job:  [id number]   cc:   Dionne Ano. Amanda Pea, M.D.  Fax: [phone]

## 2011-03-05 NOTE — Discharge Summary (Signed)
NAMEKAMIL, [name]               ACCOUNT NO.:  [id number]   MEDICAL RECORD NO.:  [id number]          PATIENT TYPE:  OUT   LOCATION:  EKG                          FACILITY:  [facility]   PHYSICIAN:  Alvester Morin, M.D.  DATE OF BIRTH:  09/11/1981   DATE OF ADMISSION:  01/11/2009  DATE OF DISCHARGE:  01/11/2009                               DISCHARGE SUMMARY   DISCHARGE DIAGNOSES:  1. Chest pain, unclear etiology.  2. Hypertension.  3. Polysubstance abuse including alcohol, marijuana, tobacco, and      history of cocaine abuse.  4. Isolated CK elevation.   DISCHARGE MEDICATIONS:  1. Coreg 6.25 mg p.o. b.i.d.  2. Aspirin 325 mg p.o. daily.  3. Folic acid 1 mg p.o. daily.  4. Thiamine 100 mg  p.o. daily.  5. Nitroglycerin 0.4 mg sublingual p.r.n. q.5 minutes x3 p.r.n. for      chest pain.  6. Protonix 40 mg po daily.   PROCEDURES DONE IN THE HOSPITALIZATION:  1. Chest x-ray done on January 19, 2009, is negative for any acute      cardiopulmonary findings.  2. A Myoview done on January 21, 2009, is negative for any stressed-      induced ischemia with ejection fraction of 54%.  3. A 2-D echocardiogram done on January 19, 2009, showed within normal      limits with normal ejection fraction of 65% without any wall motion      abnormality.   CONSULTATION:  [facility].   BRIEF HISTORY OF PRESENT ILLNESS:  [name] is a 30 year old  gentleman with history as mentioned above was getting workup for chest  pain from the Outpatient Clinic.  He came to the Echo Lab on the day of  admission.  While on his way from the parking lot to the Echo Lab, he  had some chest pain.  The chest pain persisted while he was getting his  echo.  It was severe, sharp, sudden onset, especially on the left side  of the chest with associated shortness of breath, some diaphoresis but  without nausea and vomiting.  I was paged by the cardiologist who was  treating the chest pain and was  asked to admit the patient.  The patient  on further questioning says that he is having pain like this for last 2-  3 years on and off nitro.  The chest pain is associated with exertion.  There is no fever or chills, cough or radiation of the pain.  The  patient, however, endorsed that he has some numbness of his left  fingers.  The pain lasted for about an hour each time.  After admission  to a bed, myself evidenced that the patient was having a very  excruciating pain during the physical examination.  He was also having  shortness of breath.  A 12-lead EKG was all right at the time of chest  pain.   PHYSICAL EXAMINATION:  VITAL SIGNS:  Temperature 97.3, pulse 94,  respirations 24, blood pressure 142/85, oxygen saturation 99% on 2 L.  GENERAL:  Initially, the patient  was not in acute distress, but as  mentioned above, later the patient was in acute distress from pain.  NECK:  No JVD, carotid bruit, or lymphadenopathy appreciated.  CHEST:  Bilateral clear to auscultation.  CARDIOVASCULAR:  Regular rate and rhythm.  No rubs, murmurs, or gallops.  ABDOMEN:  Bowel sounds normal.  Soft, nontender.  No organomegaly.  EXTREMITIES:  No pitting pedal edema.  No calf tenderness.   LABORATORY DATA:  On the day of admission, WBC 12.1, hemoglobin 13.9,  platelets 241 with absolute count of 7.3.  PT 13.2, INR 1, PTT 30.  Sodium 137, potassium 3.7, chloride 108, bicarb 24, glucose 99, BUN 11,  creatinine 1.06, total bilirubin 0.8, alkaline phosphatase 57, AST 31,  ALT 31, total protein 6.6, albumin 4.1, calcium 9.3.  Lipase 23,  magnesium 2.3, BNP less than 30.   HOSPITAL COURSE:  1. Chest pain.  The patient was admitted to tele bed with this nature      of chest pain with a concern about aortic dissection, especially in      a patient with a history of cocaine abuse.  Blood pressure was      checked in both arms and was not significantly different.  Chest x-      ray was done, which did not show  any evidence of mediastinal      widening, although this is not a sensitive chest.  The patient also      denied any further episodes of excruciating pain as evidenced by      myself.  We cycled cardiac enzymes and was negative x3 except for      isolated elevation of CK, highest being 603.  We also checked for      primary risk factor.  A hemoglobin A1c was 5.8, LDL was 108, HDL      45.  We also ruled out pancreatitis, and his lipase was normal at      23.  EKG at admission and subsequent EKG were unchanged.  We      consulted Cardiology and the patient had a Myoview done on the      third day of admission, which was essentially within normal limit.      I spoke with the cardiologist and it was deemed safe for him to go      home.  The patient was started on Coreg, which is a new medication      to control his blood pressure.  2. Hypertension.  In the hospital, his blood pressure was mildly      elevated with the highest being 145/93.  This was treated with      Coreg, and the patient was discharged home with Coreg 6.25 b.i.d.  3. Polysubstance abuse.  The patient was advised and counseled by the      social worker for smoking cessation and cessation of all other      substance abuse.  4. Isolated CK.  This was noted on prior admission as well.  The      patient had this in this admission as well, but this is just      followed conservatively.  The patient needs CK level on followup      visit.   DISCHARGE VITAL SIGNS:  Temperature 97.9, pulse 73, respirations 20,  blood pressure 145/93, sating 94% on room air.   CONDITION ON DISCHARGE:  No chest pain, no shortness of breath.   DISPOSITION AND FOLLOWUP:  [name] will follow up in the Outpatient  Clinic.  He will be getting his appointment letters since this is a  weekend.   FOLLOWUP ISSUES:  1. Persistence of the pain.  This seems less likely cardiac in origin.      Please note that the patient was crying many times in the  hospital      and upon multiple questioning says, this is something which venous      deficient can be      observed.  So, this include some psychiatric and anxiety component.      This may needs to be addressed and he may need a referral to a      psychiatrist.  2. Isolated elevation of CK.  The patient needs a total CK done as a      followup visit.      Jason Coop, MD  Electronically Signed      Alvester Morin, M.D.  Electronically Signed    YP/MEDQ  D:  01/21/2009  T:  01/22/2009  Job:  [id number]   cc:   [facility]

## 2011-03-05 NOTE — H&P (Signed)
NAMEWILLOUGHBY, DOELL               ACCOUNT NO.:  [id number]   MEDICAL RECORD NO.:  [id number]          PATIENT TYPE:  INP   LOCATION:  1314                         FACILITY:  [facility]   PHYSICIAN:  Joylene John, MD       DATE OF BIRTH:  April 06, 1981   DATE OF ADMISSION:  11/27/2008  DATE OF DISCHARGE:                              HISTORY & PHYSICAL   REASON FOR ADMISSION:  Severe right upper extremity pain.   HISTORY OF PRESENT ILLNESS:  This is a 30 year old African American male  coming in with excruciating right upper extremity pain, which started  for approximately 3 days ago.  Per the patient, there has been no  triggering incident that he can pinpoint.  Per the patient, he woke up  from his sleep and his right upper extremity approximately 3 days was  hurting.  According to the patient and the wife, the pain yesterday was  tolerable, however, over the night, it got worse to the point that the  patient was not able to fall asleep.  This prompted the patient to come  to the emergency room to seek further care.  According to the patient,  he cannot remember any triggering events such as cold weather or alcohol  use that might have caused the incident with his hand.  He denied any  similar incidents when he was in the hospital last year in November for  his ankle pain as well.  The patient does tell me that he has family  history of sickle cell trait and that he has sickle cell trait as well.  He knows this for fact because his daughter has sickle cell trait.  The  patient denies any fevers, chills, nausea, or vomiting, shortness of  breath, or any trauma to the extremity.   PAST MEDICAL HISTORY:  1. Sickle cell trait.  2. Hypertension.  3. Alcohol and tobacco use.  4. Recent admission in November for left ankle cellulitis (please      refer to the HPI and the discharge summary from November 2009 for      further details).   FAMILY HISTORY:  Noncontributory.   SOCIAL HISTORY:   The patient is married and has 2 daughters.  He is  active smoker half-pack per day, alcohol he uses daily about 2-3 beers.  He works as a Financial risk analyst.   DRUG ALLERGIES:  PENICILLIN.   CURRENT MEDICATIONS:  The patient does not take any medications at home.   REVIEW OF SYSTEMS:  Positive for severe right upper extremity pain.  No  fevers, chills, nausea, vomiting, shortness of breath, otherwise a 14-  point review of systems is negative.   PHYSICAL EXAMINATION:  VITAL SIGNS:  Temperature is 98.8, pulse ranging  from 89 to 80, respirations 20-18, and blood pressure 146/93.  GENERAL:  This is a pleasant African American male in mild distress from  pain.  However, he is awake and alert and able to answer questions  appropriately.  HEENT:  There is no pallor or icterus appreciated.  NECK:  No JVD appreciated.  LUNGS:  Clear to  auscultation.  CARDIOVASCULAR:  Regular rate and rhythm.  No murmurs heard.  EXTREMITIES:  On right upper extremity exam, right upper extremity is  swollen, is mildly erythematous and warm to touch when compared to the  left upper extremity.  He does have good pulses and a good capillary  refill.  The patient denied any further examination for movement and  strength secondary to pain.   LABORATORY DATA:  Pertinent lab values on the patient show white count  of 17.5, hemoglobin 13.4, hematocrit 40.9, platelets 227, sodium 157,  potassium 4, chloride 106, bicarb 25, BUN 13, creatinine 0.125, glucose  120, and calcium 9.  His ESR was 112 and CK level is 723.  No imaging  has been done while the patient was in the ER.   IMPRESSION AND PLAN:  This is a 30 year old African American male coming  in with severe right upper extremity pain.  Plan is to admit the patient  and control pain with Dilaudid.  We will also plan to give some IV  fluids, normal saline for approximately 18 hours, and recheck of his  total CK level in the morning.  Hand consult has been obtained to help   manage his pain and to make sure that there is no need for any surgical  intervention or any further need to make sure that the patient does not  have any compartment syndrome or such.  Plan is also to get some  rheumatological labs since I did not see any in the computer such as  ANA, rheumatoid factor.  For imaging purposes, we will get right upper  extremity x-ray and ultrasound to rule out DVT.  Further labs will  include hep B and hep C.  HIV was done in 2009 and was negative.  GC and  chlamydia strain was done at that time was negative, plan to repeat that  this admission as well.  We will get a hemoglobin, electrophoresis to  double check and make sure that it is sickle cell trait that the patient  has.  Per the consult recommendations, slow releasing Indocin has been  started.  After checking white count tomorrow, if the white count is  better then to consider a steroid to help with the inflammation.  Rheumatology consult may be needed further based on the lab results from  today.      Joylene John, MD  Electronically Signed     RP/MEDQ  D:  11/27/2008  T:  11/28/2008  Job:  [id number]

## 2011-03-10 ENCOUNTER — Emergency Department (HOSPITAL_COMMUNITY): Payer: Medicaid Other

## 2011-03-10 ENCOUNTER — Emergency Department (HOSPITAL_COMMUNITY)
Admission: EM | Admit: 2011-03-10 | Discharge: 2011-03-10 | Disposition: A | Discharge status: Home / Self Care | Payer: Medicaid Other | Attending: Emergency Medicine | Admitting: Emergency Medicine

## 2011-03-10 DIAGNOSIS — S6000XA Contusion of unspecified finger without damage to nail, initial encounter: Secondary | ICD-10-CM | POA: Insufficient documentation

## 2011-03-10 DIAGNOSIS — M083 Juvenile rheumatoid polyarthritis (seronegative): Secondary | ICD-10-CM | POA: Insufficient documentation

## 2011-03-10 DIAGNOSIS — IMO0002 Reserved for concepts with insufficient information to code with codable children: Secondary | ICD-10-CM | POA: Insufficient documentation

## 2011-03-10 DIAGNOSIS — M79609 Pain in unspecified limb: Secondary | ICD-10-CM | POA: Insufficient documentation

## 2011-03-10 DIAGNOSIS — Y92009 Unspecified place in unspecified non-institutional (private) residence as the place of occurrence of the external cause: Secondary | ICD-10-CM | POA: Insufficient documentation

## 2011-03-10 DIAGNOSIS — F17200 Nicotine dependence, unspecified, uncomplicated: Secondary | ICD-10-CM | POA: Insufficient documentation

## 2011-03-10 DIAGNOSIS — M7989 Other specified soft tissue disorders: Secondary | ICD-10-CM | POA: Insufficient documentation

## 2011-03-10 DIAGNOSIS — D573 Sickle-cell trait: Secondary | ICD-10-CM | POA: Insufficient documentation

## 2011-03-24 ENCOUNTER — Emergency Department (HOSPITAL_COMMUNITY)
Admission: EM | Admit: 2011-03-24 | Discharge: 2011-03-24 | Disposition: A | Discharge status: Home / Self Care | Payer: Medicaid Other | Attending: Emergency Medicine | Admitting: Emergency Medicine

## 2011-03-24 ENCOUNTER — Emergency Department (HOSPITAL_COMMUNITY): Payer: Medicaid Other

## 2011-03-24 DIAGNOSIS — IMO0002 Reserved for concepts with insufficient information to code with codable children: Secondary | ICD-10-CM | POA: Insufficient documentation

## 2011-03-24 DIAGNOSIS — X500XXA Overexertion from strenuous movement or load, initial encounter: Secondary | ICD-10-CM | POA: Insufficient documentation

## 2011-03-24 DIAGNOSIS — Z8639 Personal history of other endocrine, nutritional and metabolic disease: Secondary | ICD-10-CM | POA: Insufficient documentation

## 2011-03-24 DIAGNOSIS — M083 Juvenile rheumatoid polyarthritis (seronegative): Secondary | ICD-10-CM | POA: Insufficient documentation

## 2011-03-24 DIAGNOSIS — Z862 Personal history of diseases of the blood and blood-forming organs and certain disorders involving the immune mechanism: Secondary | ICD-10-CM | POA: Insufficient documentation

## 2011-03-24 DIAGNOSIS — M25569 Pain in unspecified knee: Secondary | ICD-10-CM | POA: Insufficient documentation

## 2011-03-24 DIAGNOSIS — M25469 Effusion, unspecified knee: Secondary | ICD-10-CM | POA: Insufficient documentation

## 2011-05-18 ENCOUNTER — Emergency Department (HOSPITAL_COMMUNITY): Payer: Medicaid Other

## 2011-05-18 ENCOUNTER — Emergency Department (HOSPITAL_COMMUNITY)
Admission: EM | Admit: 2011-05-18 | Discharge: 2011-05-18 | Disposition: A | Discharge status: Home / Self Care | Payer: Medicaid Other | Attending: Emergency Medicine | Admitting: Emergency Medicine

## 2011-05-18 DIAGNOSIS — Z862 Personal history of diseases of the blood and blood-forming organs and certain disorders involving the immune mechanism: Secondary | ICD-10-CM | POA: Insufficient documentation

## 2011-05-18 DIAGNOSIS — S93519A Sprain of interphalangeal joint of unspecified toe(s), initial encounter: Secondary | ICD-10-CM | POA: Insufficient documentation

## 2011-05-18 DIAGNOSIS — M7989 Other specified soft tissue disorders: Secondary | ICD-10-CM | POA: Insufficient documentation

## 2011-05-18 DIAGNOSIS — F17200 Nicotine dependence, unspecified, uncomplicated: Secondary | ICD-10-CM | POA: Insufficient documentation

## 2011-05-18 DIAGNOSIS — M083 Juvenile rheumatoid polyarthritis (seronegative): Secondary | ICD-10-CM | POA: Insufficient documentation

## 2011-05-18 DIAGNOSIS — M79609 Pain in unspecified limb: Secondary | ICD-10-CM | POA: Insufficient documentation

## 2011-05-18 DIAGNOSIS — D573 Sickle-cell trait: Secondary | ICD-10-CM | POA: Insufficient documentation

## 2011-05-18 DIAGNOSIS — Z8639 Personal history of other endocrine, nutritional and metabolic disease: Secondary | ICD-10-CM | POA: Insufficient documentation

## 2011-05-18 DIAGNOSIS — W2203XA Walked into furniture, initial encounter: Secondary | ICD-10-CM | POA: Insufficient documentation

## 2011-07-16 ENCOUNTER — Emergency Department (HOSPITAL_COMMUNITY): Payer: Medicaid Other

## 2011-07-16 ENCOUNTER — Emergency Department (HOSPITAL_COMMUNITY)
Admission: EM | Admit: 2011-07-16 | Discharge: 2011-07-16 | Disposition: A | Discharge status: Home / Self Care | Payer: Medicaid Other | Attending: Emergency Medicine | Admitting: Emergency Medicine

## 2011-07-16 DIAGNOSIS — IMO0002 Reserved for concepts with insufficient information to code with codable children: Secondary | ICD-10-CM | POA: Insufficient documentation

## 2011-07-16 DIAGNOSIS — X500XXA Overexertion from strenuous movement or load, initial encounter: Secondary | ICD-10-CM | POA: Insufficient documentation

## 2011-07-16 DIAGNOSIS — Y9389 Activity, other specified: Secondary | ICD-10-CM | POA: Insufficient documentation

## 2011-07-23 LAB — CBC
HCT: 41.5
Hemoglobin: 13.9
MCHC: 33.5
MCV: 69.6 — ABNORMAL LOW
Platelets: 215
Platelets: 230
RBC: 5.96 — ABNORMAL HIGH
RDW: 14.4
WBC: 11.6 — ABNORMAL HIGH
WBC: 12.8 — ABNORMAL HIGH

## 2011-07-23 LAB — BODY FLUID CULTURE: Culture: NO GROWTH

## 2011-07-23 LAB — GRAM STAIN

## 2011-07-23 LAB — POCT I-STAT, CHEM 8
BUN: 11
Calcium, Ion: 1.24
Chloride: 106
Creatinine, Ser: 1.3
Glucose, Bld: 106 — ABNORMAL HIGH
HCT: 45
Hemoglobin: 15.3
Potassium: 4.6
Sodium: 140
TCO2: 26

## 2011-07-23 LAB — HEPATIC FUNCTION PANEL
Albumin: 4
Total Protein: 6.5

## 2011-07-23 LAB — DIFFERENTIAL
Basophils Absolute: 0.1
Basophils Relative: 0
Eosinophils Absolute: 0.3
Eosinophils Relative: 2
Lymphocytes Relative: 30
Lymphs Abs: 3.8
Monocytes Absolute: 1.1 — ABNORMAL HIGH
Monocytes Relative: 9
Neutro Abs: 7.5
Neutrophils Relative %: 59

## 2011-07-23 LAB — SYNOVIAL CELL COUNT + DIFF, W/ CRYSTALS
Crystals, Fluid: NONE SEEN
Lymphocytes-Synovial Fld: 47 — ABNORMAL HIGH
Monocyte-Macrophage-Synovial Fluid: 18 — ABNORMAL LOW
Neutrophil, Synovial: 34 — ABNORMAL HIGH
Other Cells-SYN: 1
WBC, Synovial: 279 — ABNORMAL HIGH

## 2011-07-23 LAB — ANAEROBIC CULTURE

## 2011-07-23 LAB — BASIC METABOLIC PANEL
CO2: 28
Calcium: 9.1
Glucose, Bld: 96
Sodium: 136

## 2011-07-23 LAB — D-DIMER, QUANTITATIVE: D-Dimer, Quant: 0.33

## 2011-07-23 LAB — APTT: aPTT: 32

## 2011-07-23 LAB — C-REACTIVE PROTEIN: CRP: 0.1 — ABNORMAL LOW (ref <0.6)

## 2011-07-23 LAB — SEDIMENTATION RATE: Sed Rate: 0

## 2011-07-23 LAB — PROTIME-INR: Prothrombin Time: 13.2

## 2011-08-06 ENCOUNTER — Emergency Department (HOSPITAL_COMMUNITY)
Admission: EM | Admit: 2011-08-06 | Discharge: 2011-08-06 | Disposition: A | Discharge status: Home / Self Care | Payer: Medicaid Other | Attending: Emergency Medicine | Admitting: Emergency Medicine

## 2011-08-06 ENCOUNTER — Emergency Department (HOSPITAL_COMMUNITY): Payer: Medicaid Other

## 2011-08-06 DIAGNOSIS — R0602 Shortness of breath: Secondary | ICD-10-CM | POA: Insufficient documentation

## 2011-08-06 DIAGNOSIS — M549 Dorsalgia, unspecified: Secondary | ICD-10-CM | POA: Insufficient documentation

## 2011-08-06 DIAGNOSIS — D573 Sickle-cell trait: Secondary | ICD-10-CM | POA: Insufficient documentation

## 2011-08-06 DIAGNOSIS — F17200 Nicotine dependence, unspecified, uncomplicated: Secondary | ICD-10-CM | POA: Insufficient documentation

## 2011-08-06 DIAGNOSIS — M083 Juvenile rheumatoid polyarthritis (seronegative): Secondary | ICD-10-CM | POA: Insufficient documentation

## 2011-08-06 DIAGNOSIS — Z8639 Personal history of other endocrine, nutritional and metabolic disease: Secondary | ICD-10-CM | POA: Insufficient documentation

## 2011-08-06 DIAGNOSIS — Z862 Personal history of diseases of the blood and blood-forming organs and certain disorders involving the immune mechanism: Secondary | ICD-10-CM | POA: Insufficient documentation

## 2011-08-06 DIAGNOSIS — R072 Precordial pain: Secondary | ICD-10-CM | POA: Insufficient documentation

## 2011-08-06 DIAGNOSIS — J40 Bronchitis, not specified as acute or chronic: Secondary | ICD-10-CM | POA: Insufficient documentation

## 2011-08-06 LAB — CBC
Hemoglobin: 13.5 g/dL (ref 13.0–17.0)
MCH: 23.4 pg — ABNORMAL LOW (ref 26.0–34.0)
MCHC: 34.4 g/dL (ref 30.0–36.0)
Platelets: 204 10*3/uL (ref 150–400)

## 2011-08-06 LAB — BASIC METABOLIC PANEL
BUN: 10 mg/dL (ref 6–23)
Creatinine, Ser: 0.94 mg/dL (ref 0.50–1.35)
GFR calc Af Amer: >90 mL/min (ref >90)
GFR calc non Af Amer: >90 mL/min (ref >90)

## 2011-08-06 LAB — DIFFERENTIAL
Basophils Absolute: 0 10*3/uL (ref 0.0–0.1)
Lymphs Abs: 3.5 10*3/uL (ref 0.7–4.0)
Monocytes Absolute: 1 10*3/uL (ref 0.1–1.0)

## 2011-08-06 LAB — RAPID URINE DRUG SCREEN, HOSP PERFORMED
Amphetamines: NOT DETECTED
Barbiturates: NOT DETECTED

## 2011-08-06 LAB — POCT I-STAT TROPONIN I: Troponin i, poc: 0.01 ng/mL (ref 0.00–0.08)

## 2011-08-06 LAB — D-DIMER, QUANTITATIVE: D-Dimer, Quant: 0.23 ug/mL-FEU (ref 0.00–0.48)

## 2011-08-07 ENCOUNTER — Encounter: Payer: Self-pay | Admitting: Internal Medicine

## 2011-08-07 ENCOUNTER — Ambulatory Visit (INDEPENDENT_AMBULATORY_CARE_PROVIDER_SITE_OTHER): Payer: Medicaid Other | Admitting: Internal Medicine

## 2011-08-07 DIAGNOSIS — M109 Gout, unspecified: Secondary | ICD-10-CM

## 2011-08-07 DIAGNOSIS — M25569 Pain in unspecified knee: Secondary | ICD-10-CM

## 2011-08-07 DIAGNOSIS — M25562 Pain in left knee: Secondary | ICD-10-CM

## 2011-08-07 DIAGNOSIS — Z23 Encounter for immunization: Secondary | ICD-10-CM

## 2011-08-07 MED ORDER — INDOMETHACIN 50 MG PO CAPS: 50.0000 mg | ORAL_CAPSULE | Freq: Three times a day (TID) | ORAL | Status: DC

## 2011-08-07 NOTE — Progress Notes (Signed)
Subjective:  Patient ID: [name] male DOB: 08-20-1981 30 y.o. MRN: [id number]  HPI: [name]    Past Medical History  Diagnosis Date  . Hypertension   . Polysubstance abuse     alcohol,cocaine, marijuana, tobacco  . Anemia     baseline Hb- 12, microcytic   Current Outpatient Prescriptions  Medication Sig Dispense Refill  . carvedilol (COREG) 6.25 MG tablet Take 6.25 mg by mouth 2 (two) times daily with meals.        . cyclobenzaprine (FLEXERIL) 5 MG tablet Take 5 mg by mouth 3 (three) times daily as needed.        . diclofenac (VOLTAREN) 75 MG EC tablet Take 75 mg by mouth 2 (two) times daily.        . lansoprazole (PREVACID) 30 MG capsule Take 30 mg by mouth 2 (two) times daily.        Marland Kitchen oxyCODONE-acetaminophen (PERCOCET) 5-325 MG per tablet Take 1 tablet by mouth every 4 (four) hours as needed.        . tramadol-acetaminophen (ULTRACET) 37.5-325 MG per tablet Take 1-2 tablets by mouth every 6 (six) hours as needed.         No family history on file. History   Social History  . Marital Status: Married    Spouse Name: N/A    Number of Children: N/A  . Years of Education: N/A   Social History Main Topics  . Smoking status: Not on file  . Smokeless tobacco: Not on file  . Alcohol Use: Not on file  . Drug Use: Not on file  . Sexually Active: Not on file   Other Topics Concern  . Not on file   Social History Narrative  . No narrative on file    Review of Systems: Constitutional: Denies fever, chills, diaphoresis, appetite change and fatigue.  HEENT: Denies photophobia, eye pain, redness, hearing loss, ear pain, congestion, sore throat, rhinorrhea, sneezing, mouth sores, trouble swallowing, neck pain, neck stiffness and tinnitus.  Respiratory: Denies SOB, DOE, cough, chest tightness, and wheezing.  Cardiovascular: Denies chest pain, palpitations and leg swelling.  Gastrointestinal: Denies nausea, vomiting, abdominal pain, diarrhea, constipation, blood  in stool and abdominal distention.  Genitourinary: Denies dysuria, urgency, frequency, hematuria, flank pain and difficulty urinating.  Musculoskeletal: Denies myalgias, back pain, joint swelling, reports left knee giving way; no locking.  Skin: Denies pallor, rash and wound.  Neurological: Denies dizziness, seizures, syncope, weakness, light-headedness, numbness and headaches.  Hematological: Denies adenopathy. Easy bruising, personal or family bleeding history  Psychiatric/Behavioral: Denies suicidal ideation, mood changes, confusion, nervousness, sleep disturbance and agitation   Vitals: reviewed General: alert, well-developed, and cooperative to examination.  Head: normocephalic and atraumatic.  Eyes: vision grossly intact, pupils equal, pupils round, pupils reactive to light, no injection and anicteric.  Mouth: pharynx pink and moist, no erythema, and no exudates.  Neck: supple, full ROM, no thyromegaly, no JVD, and no carotid bruits.  Lungs: normal respiratory effort, no accessory muscle use, normal breath sounds, no crackles, and no wheezes. Heart: normal rate, regular rhythm, no murmur, no gallop, and no rub.  Abdomen: soft, non-tender, normal bowel sounds, no distention, no guarding, no rebound tenderness, no hepatomegaly, and no splenomegaly.  Msk: no joint swelling, no joint warmth, and no redness over joints.  Pulses: 2+ DP/PT pulses bilaterally Extremities: No cyanosis, clubbing, edema Neurologic: alert & oriented X3, cranial nerves II-XII intact, strength normal in all extremities, sensation intact to light touch, and gait  normal.  Skin: turgor normal and no rashes.  Psych: Oriented X3, memory intact for recent and remote, normally interactive, good eye contact, not anxious appearing, and not depressed appearing.    Assessment & Plan:  1. Left knee pain sp trauma with a possible meniscus tear. Although, there is no findings on a physical exam, history of left knee giving way  and falls are of concern. -xray neg for dislocation/frx -indomethacin for pain - d/c ultracet, percocet, flexeryl and diclofenac -MRI of left knee (confirmed by dr. Josem Kaufmann) -f/u after MRi is done.

## 2011-08-07 NOTE — Patient Instructions (Signed)
Please, follow up with an MRI of left knee. Fill in a prescription for Indomethacin. Please, follow up  2-3 days after MRI is done and call with any questions.

## 2011-08-07 NOTE — Progress Notes (Deleted)
  Subjective:    Patient ID: [name], male    DOB: 01/25/1981, 30 y.o.   MRN: [id number]  HPI    Review of Systems     Objective:   Physical Exam        Assessment & Plan:

## 2011-08-08 NOTE — Progress Notes (Signed)
HPI: 1. Follow up on his left knee injury, see ED note. Reports no improvement and now his left LE "giving way."  Past Medical History  Diagnosis Date  . Hypertension   . Polysubstance abuse     alcohol,cocaine, marijuana, tobacco  . Anemia     baseline Hb- 12, microcytic   Current Outpatient Prescriptions  Medication Sig Dispense Refill  . carvedilol (COREG) 6.25 MG tablet Take 6.25 mg by mouth 2 (two) times daily with meals.        . lansoprazole (PREVACID) 30 MG capsule Take 30 mg by mouth 2 (two) times daily.        . indomethacin (INDOCIN) 50 MG capsule Take 1 capsule (50 mg total) by mouth 3 (three) times daily with meals.  90 capsule  6   No family history on file. History   Social History  . Marital Status: Married    Spouse Name: N/A    Number of Children: N/A  . Years of Education: N/A   Social History Main Topics  . Smoking status: Current Everyday Smoker  . Smokeless tobacco: None  . Alcohol Use: No  . Drug Use: No  . Sexually Active: None   Other Topics Concern  . None   Social History Narrative  . None    Review of Systems: Constitutional: Denies fever, chills, diaphoresis, appetite change and fatigue.  HEENT: Denies photophobia, eye pain, redness, hearing loss, ear pain, congestion, sore throat, rhinorrhea, sneezing, mouth sores, trouble swallowing, neck pain, neck stiffness and tinnitus.  Respiratory: Denies SOB, DOE, cough, chest tightness, and wheezing.  Cardiovascular: Denies chest pain, palpitations and leg swelling.  Gastrointestinal: Denies nausea, vomiting, abdominal pain, diarrhea, constipation, blood in stool and abdominal distention.  Genitourinary: Denies dysuria, urgency, frequency, hematuria, flank pain and difficulty urinating.  Musculoskeletal: Denies myalgias, back pain, joint swelling, arthralgias and gait problem.  Skin: Denies pallor, rash and wound.  Neurological: Denies dizziness, seizures, syncope, weakness, light-headedness,  numbness and headaches.  Hematological: Denies adenopathy. Easy bruising, personal or family bleeding history  Psychiatric/Behavioral: Denies suicidal ideation, mood changes, confusion, nervousness, sleep disturbance and agitation   Vitals: reviewed General: alert, well-developed, and cooperative to examination.  Head: normocephalic and atraumatic.  Eyes: vision grossly intact, pupils equal, pupils round, pupils reactive to light, no injection and anicteric.  Mouth: pharynx pink and moist, no erythema, and no exudates.  Neck: supple, full ROM, no thyromegaly, no JVD, and no carotid bruits.  Lungs: normal respiratory effort, no accessory muscle use, normal breath sounds, no crackles, and no wheezes. Heart: normal rate, regular rhythm, no murmur, no gallop, and no rub.  Abdomen: soft, non-tender, normal bowel sounds, no distention, no guarding, no rebound tenderness, no hepatomegaly, and no splenomegaly.  Msk: no joint swelling, no joint warmth, and no redness over joints.  Pulses: 2+ DP/PT pulses bilaterally Extremities: No cyanosis, clubbing, edema Neurologic: alert & oriented X3, cranial nerves II-XII intact, strength normal in all extremities, sensation intact to light touch, and gait normal.  Skin: turgor normal and no rashes.  Psych: Oriented X3, memory intact for recent and remote, normally interactive, good eye contact, not anxious appearing, and not depressed appearing.    Assessment & Plan:  1. Left knee pain sp trauma with a possible meniscus tear. Although, there is no findings on a physical exam, history of left knee giving way and falls are of concern. -xray neg for dislocation/frx -indomethacin for pain - d/c ultracet, percocet, flexeryl and diclofenac -MRI of left knee (  confirmed by dr. Josem Kaufmann) -f/u after MRi is done.

## 2011-08-14 ENCOUNTER — Other Ambulatory Visit: Payer: Self-pay | Admitting: Internal Medicine

## 2011-08-14 NOTE — Progress Notes (Signed)
Patient's health insurance denied MRI of right knee. Will cancel the test for now and reevaluate the patient in 2 weeks.

## 2011-08-14 NOTE — Progress Notes (Deleted)
  Subjective:    Patient ID: [name], male    DOB: 01/11/1981, 30 y.o.   MRN: [id number]  HPI    Review of Systems     Objective:   Physical Exam        Assessment & Plan:   

## 2011-08-14 NOTE — Progress Notes (Signed)
Addended by: Maura Crandall on: 08/14/2011 02:43 PM   Modules accepted: Orders

## 2011-08-15 ENCOUNTER — Inpatient Hospital Stay (HOSPITAL_COMMUNITY): Admission: RE | Admit: 2011-08-15 | Payer: Medicaid Other | Source: Ambulatory Visit

## 2011-08-27 ENCOUNTER — Encounter: Payer: Medicaid Other | Admitting: Internal Medicine

## 2011-09-03 ENCOUNTER — Encounter: Payer: Medicaid Other | Admitting: Internal Medicine

## 2011-11-06 ENCOUNTER — Emergency Department (HOSPITAL_COMMUNITY)
Admission: EM | Admit: 2011-11-06 | Discharge: 2011-11-06 | Disposition: A | Discharge status: Home / Self Care | Payer: Self-pay | Attending: Emergency Medicine | Admitting: Emergency Medicine

## 2011-11-06 ENCOUNTER — Encounter (HOSPITAL_COMMUNITY): Payer: Self-pay

## 2011-11-06 DIAGNOSIS — M129 Arthropathy, unspecified: Secondary | ICD-10-CM | POA: Insufficient documentation

## 2011-11-06 DIAGNOSIS — R51 Headache: Secondary | ICD-10-CM | POA: Insufficient documentation

## 2011-11-06 DIAGNOSIS — K089 Disorder of teeth and supporting structures, unspecified: Secondary | ICD-10-CM | POA: Insufficient documentation

## 2011-11-06 DIAGNOSIS — H9209 Otalgia, unspecified ear: Secondary | ICD-10-CM | POA: Insufficient documentation

## 2011-11-06 DIAGNOSIS — I10 Essential (primary) hypertension: Secondary | ICD-10-CM | POA: Insufficient documentation

## 2011-11-06 DIAGNOSIS — Z8639 Personal history of other endocrine, nutritional and metabolic disease: Secondary | ICD-10-CM | POA: Insufficient documentation

## 2011-11-06 DIAGNOSIS — R110 Nausea: Secondary | ICD-10-CM | POA: Insufficient documentation

## 2011-11-06 DIAGNOSIS — Z862 Personal history of diseases of the blood and blood-forming organs and certain disorders involving the immune mechanism: Secondary | ICD-10-CM | POA: Insufficient documentation

## 2011-11-06 DIAGNOSIS — Z79899 Other long term (current) drug therapy: Secondary | ICD-10-CM | POA: Insufficient documentation

## 2011-11-06 DIAGNOSIS — K029 Dental caries, unspecified: Secondary | ICD-10-CM | POA: Insufficient documentation

## 2011-11-06 HISTORY — DX: Unspecified osteoarthritis, unspecified site: M19.90

## 2011-11-06 MED ORDER — ERYTHROMYCIN BASE 250 MG PO TABS: 500.0000 mg | ORAL_TABLET | Freq: Four times a day (QID) | ORAL | Status: AC

## 2011-11-06 MED ORDER — OXYCODONE-ACETAMINOPHEN 5-325 MG PO TABS
1.0000 | ORAL_TABLET | Freq: Once | ORAL | Status: AC
  Administered 2011-11-06: 1 via ORAL
  Filled 2011-11-06: qty 1

## 2011-11-06 MED ORDER — OXYCODONE-ACETAMINOPHEN 5-325 MG PO TABS: 2.0000 | ORAL_TABLET | ORAL | Status: AC | PRN

## 2011-11-06 MED ORDER — METOCLOPRAMIDE HCL 10 MG PO TABS: ORAL_TABLET | ORAL | Status: DC

## 2011-11-06 NOTE — ED Notes (Signed)
Pt presents with 2 day h/o R lower dental pain.  Pt reports multiple broken teeth with swelling to R lower jaw, R sided ear pain and migraine.

## 2011-11-06 NOTE — ED Provider Notes (Signed)
History     CSN: [id number]  Arrival date & time 11/06/11  1453   First MD Initiated Contact with Patient 11/06/11 1626      Chief Complaint  Patient presents with  . Dental Pain    (Consider location/radiation/quality/duration/timing/severity/associated sxs/prior treatment) Patient is a 31 y.o. male presenting with tooth pain. The history is provided by the patient.  Dental PainThe primary symptoms include headaches. Primary symptoms do not include fever or shortness of breath.   the patient is a 31 year old, male, with a history of hypertension, substance abuse, arthritis, and gout, who presents to the emergency department complaining of an earache and pain in his lower teeth on the right-hand side.  Past 3 days.  He's been nauseated.  He denies vomiting, to his teeth, recent dental work, fevers, or chills.  He smokes cigarettes.  Past Medical History  Diagnosis Date  . Hypertension   . Polysubstance abuse     alcohol,cocaine, marijuana, tobacco  . Anemia     baseline Hb- 12, microcytic  . Arthritis   . Gout     History reviewed. No pertinent past surgical history.  No family history on file.  History  Substance Use Topics  . Smoking status: Current Everyday Smoker  . Smokeless tobacco: Not on file  . Alcohol Use: No      Review of Systems  Constitutional: Negative for fever and chills.  HENT: Positive for dental problem.   Respiratory: Negative for shortness of breath.   Gastrointestinal: Positive for nausea. Negative for vomiting.  Neurological: Positive for headaches.  Psychiatric/Behavioral: Negative for confusion.  All other systems reviewed and are negative.    Allergies  Amoxicillin; Ampicillin; and Penicillins  Home Medications   Current Outpatient Rx  Name Route Sig Dispense Refill  . INDOMETHACIN 50 MG PO CAPS Oral Take 1 capsule (50 mg total) by mouth 3 (three) times daily with meals. 90 capsule 6    BP 162/112  Pulse 65  Temp(Src) 99 F  (37.2 C) (Oral)  Resp 22  Ht 5\' 8"  (1.727 m)  Wt 219 lb (99.338 kg)  BMI 33.30 kg/m2  SpO2 98%  Physical Exam  Constitutional: He is oriented to person, place, and time. He appears well-developed and well-nourished.       Uncomfortable appearing  HENT:  Head: Normocephalic and atraumatic.  Right Ear: External ear normal.       Rotten #30 and 31.  Teeth  Eyes: Conjunctivae are normal. Pupils are equal, round, and reactive to light.  Neck: Normal range of motion. Neck supple.  Musculoskeletal: Normal range of motion.  Neurological: He is alert and oriented to person, place, and time.  Skin: Skin is warm and dry.  Psychiatric: He has a normal mood and affect.    ED Course  Procedures (including critical care time) Severe dental periods.  No indication for testing in the emergency department.  Labs Reviewed - No data to display No results found.   No diagnosis found.    MDM  Dental caries        Nicholes Stairs, MD 11/06/11 1651

## 2012-12-09 ENCOUNTER — Emergency Department (HOSPITAL_COMMUNITY)
Admission: EM | Admit: 2012-12-09 | Discharge: 2012-12-09 | Disposition: A | Discharge status: Home / Self Care | Payer: Self-pay | Attending: Emergency Medicine | Admitting: Emergency Medicine

## 2012-12-09 ENCOUNTER — Encounter (HOSPITAL_COMMUNITY): Payer: Self-pay | Admitting: Emergency Medicine

## 2012-12-09 ENCOUNTER — Emergency Department (HOSPITAL_COMMUNITY): Payer: Self-pay

## 2012-12-09 DIAGNOSIS — R0982 Postnasal drip: Secondary | ICD-10-CM | POA: Insufficient documentation

## 2012-12-09 DIAGNOSIS — Z79899 Other long term (current) drug therapy: Secondary | ICD-10-CM | POA: Insufficient documentation

## 2012-12-09 DIAGNOSIS — F17200 Nicotine dependence, unspecified, uncomplicated: Secondary | ICD-10-CM | POA: Insufficient documentation

## 2012-12-09 DIAGNOSIS — R5381 Other malaise: Secondary | ICD-10-CM | POA: Insufficient documentation

## 2012-12-09 DIAGNOSIS — R209 Unspecified disturbances of skin sensation: Secondary | ICD-10-CM | POA: Insufficient documentation

## 2012-12-09 DIAGNOSIS — I10 Essential (primary) hypertension: Secondary | ICD-10-CM | POA: Insufficient documentation

## 2012-12-09 DIAGNOSIS — Z862 Personal history of diseases of the blood and blood-forming organs and certain disorders involving the immune mechanism: Secondary | ICD-10-CM | POA: Insufficient documentation

## 2012-12-09 DIAGNOSIS — J40 Bronchitis, not specified as acute or chronic: Secondary | ICD-10-CM

## 2012-12-09 DIAGNOSIS — M109 Gout, unspecified: Secondary | ICD-10-CM | POA: Insufficient documentation

## 2012-12-09 DIAGNOSIS — B9789 Other viral agents as the cause of diseases classified elsewhere: Secondary | ICD-10-CM | POA: Insufficient documentation

## 2012-12-09 DIAGNOSIS — R29898 Other symptoms and signs involving the musculoskeletal system: Secondary | ICD-10-CM | POA: Insufficient documentation

## 2012-12-09 DIAGNOSIS — R059 Cough, unspecified: Secondary | ICD-10-CM | POA: Insufficient documentation

## 2012-12-09 DIAGNOSIS — M549 Dorsalgia, unspecified: Secondary | ICD-10-CM | POA: Insufficient documentation

## 2012-12-09 DIAGNOSIS — B349 Viral infection, unspecified: Secondary | ICD-10-CM

## 2012-12-09 DIAGNOSIS — R0602 Shortness of breath: Secondary | ICD-10-CM | POA: Insufficient documentation

## 2012-12-09 DIAGNOSIS — Z8739 Personal history of other diseases of the musculoskeletal system and connective tissue: Secondary | ICD-10-CM | POA: Insufficient documentation

## 2012-12-09 DIAGNOSIS — M5430 Sciatica, unspecified side: Secondary | ICD-10-CM

## 2012-12-09 DIAGNOSIS — F1910 Other psychoactive substance abuse, uncomplicated: Secondary | ICD-10-CM | POA: Insufficient documentation

## 2012-12-09 DIAGNOSIS — R197 Diarrhea, unspecified: Secondary | ICD-10-CM | POA: Insufficient documentation

## 2012-12-09 DIAGNOSIS — J3489 Other specified disorders of nose and nasal sinuses: Secondary | ICD-10-CM | POA: Insufficient documentation

## 2012-12-09 LAB — CBC WITH DIFFERENTIAL/PLATELET
Eosinophils Relative: 0 % (ref 0–5)
Lymphocytes Relative: 18 % (ref 12–46)
Lymphs Abs: 2.2 10*3/uL (ref 0.7–4.0)
MCV: 67 fL — ABNORMAL LOW (ref 78.0–100.0)
Monocytes Relative: 11 % (ref 3–12)
Neutrophils Relative %: 71 % (ref 43–77)
Platelets: 241 10*3/uL (ref 150–400)
RBC: 6.16 MIL/uL — ABNORMAL HIGH (ref 4.22–5.81)
WBC: 12.1 10*3/uL — ABNORMAL HIGH (ref 4.0–10.5)

## 2012-12-09 LAB — COMPREHENSIVE METABOLIC PANEL
ALT: 21 U/L (ref 0–53)
AST: 27 U/L (ref 0–37)
Albumin: 3.8 g/dL (ref 3.5–5.2)
Alkaline Phosphatase: 56 U/L (ref 39–117)
Calcium: 9.5 mg/dL (ref 8.4–10.5)
Potassium: 3.8 mEq/L (ref 3.5–5.1)
Sodium: 136 mEq/L (ref 135–145)
Total Protein: 7 g/dL (ref 6.0–8.3)

## 2012-12-09 MED ORDER — SODIUM CHLORIDE 0.9 % IV SOLN: 1000.0000 mL | INTRAVENOUS | Status: DC

## 2012-12-09 MED ORDER — MORPHINE SULFATE 4 MG/ML IJ SOLN
4.0000 mg | Freq: Once | INTRAMUSCULAR | Status: AC
  Administered 2012-12-09: 4 mg via INTRAVENOUS
  Filled 2012-12-09: qty 1

## 2012-12-09 MED ORDER — METHOCARBAMOL 500 MG PO TABS: 500.0000 mg | ORAL_TABLET | Freq: Two times a day (BID) | ORAL | Status: DC

## 2012-12-09 MED ORDER — SODIUM CHLORIDE 0.9 % IV SOLN: 1000.0000 mL | Freq: Once | INTRAVENOUS | Status: DC

## 2012-12-09 MED ORDER — ONDANSETRON HCL 4 MG/2ML IJ SOLN
4.0000 mg | Freq: Once | INTRAMUSCULAR | Status: AC
  Administered 2012-12-09: 4 mg via INTRAVENOUS
  Filled 2012-12-09: qty 2

## 2012-12-09 MED ORDER — HYDROCODONE-HOMATROPINE 5-1.5 MG/5ML PO SYRP: 5.0000 mL | ORAL_SOLUTION | Freq: Four times a day (QID) | ORAL | Status: DC | PRN

## 2012-12-09 MED ORDER — SODIUM CHLORIDE 0.9 % IV SOLN
1000.0000 mL | Freq: Once | INTRAVENOUS | Status: AC
  Administered 2012-12-09: 1000 mL via INTRAVENOUS

## 2012-12-09 MED ORDER — WHITE PETROLATUM GEL
Status: AC
  Administered 2012-12-09: 12:00:00
  Filled 2012-12-09: qty 5

## 2012-12-09 MED ORDER — NAPROXEN 500 MG PO TABS: 500.0000 mg | ORAL_TABLET | Freq: Two times a day (BID) | ORAL | Status: DC

## 2012-12-09 MED ORDER — HYDROCODONE-ACETAMINOPHEN 5-325 MG PO TABS: 2.0000 | ORAL_TABLET | ORAL | Status: DC | PRN

## 2012-12-09 NOTE — ED Provider Notes (Signed)
Medical screening examination/treatment/procedure(s) were performed by non-physician practitioner and as supervising physician I was immediately available for consultation/collaboration.  Timoteo Carreiro, MD 12/09/12 1654 

## 2012-12-09 NOTE — ED Notes (Signed)
Pt c/o of bilaterally leg numbness that is worse on the left and also has sharp pains that shoot intermittently through his legs. States that all this started about 2 days ago along with n/v. C/o of chest pain at times but not at the moment.

## 2012-12-09 NOTE — ED Provider Notes (Signed)
History     CSN: [id number]  Arrival date & time 12/09/12  1029   First MD Initiated Contact with Patient 12/09/12 1041      Chief Complaint  Patient presents with  . Nausea  . Emesis  . Extremity Weakness    leg numbness     (Consider location/radiation/quality/duration/timing/severity/associated sxs/prior treatment) The history is provided by the patient, medical records and the spouse.    [name] is a 32 y.o. 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  Pt describes the pain sharp, rated at a 10/10 and radiating down the L hip.  Associated symptoms include paresthesias in the L hip and leg.  Nothing makes it better and sitting, standing, walking makes it worse.  He denies bowel or bladder incontinence, loss of sensation or loss of function in his legs.  Pt denies fever, chills.  Pt also c/o SOB, coughing, chest pain, vomiting, nausea, diarrhea.  Pt states these symptoms have been present since Sunday ans he has had sick contacts at work with the same.  He did not receive a flu shot this year. Pt states the flu-like symptoms began before the back pain.  He was spending most of his time in bed when the back pain began.  Pt states the chest pain occurs when he inhales or coughs.  He is chest pain free at this time.     Past Medical History  Diagnosis Date  . Hypertension   . Polysubstance abuse     alcohol,cocaine, marijuana, tobacco  . Anemia     baseline Hb- 12, microcytic  . Arthritis   . Gout     History reviewed. No pertinent past surgical history.  No family history on file.  History  Substance Use Topics  . Smoking status: Current Every Day Smoker  . Smokeless tobacco: Not on file  . Alcohol Use: No      Review of Systems  Constitutional: Positive for appetite change and  fatigue. Negative for fever, diaphoresis and unexpected weight change.  HENT: Positive for congestion, rhinorrhea and postnasal drip. Negative for mouth sores, neck pain and neck stiffness.   Eyes: Negative for visual disturbance.  Respiratory: Positive for cough and shortness of breath. Negative for chest tightness and wheezing.   Cardiovascular: Negative for chest pain.  Gastrointestinal: Positive for nausea, vomiting and diarrhea. Negative for abdominal pain and constipation.  Endocrine: Negative for polydipsia, polyphagia and polyuria.  Genitourinary: Negative for dysuria, urgency, frequency and hematuria.  Musculoskeletal: Positive for back pain and gait problem ( 2/2 pain). Negative for joint swelling.  Skin: Negative for rash.  Allergic/Immunologic: Negative for immunocompromised state.  Neurological: Negative for syncope, weakness, light-headedness, numbness and headaches.  Hematological: Does not bruise/bleed easily.  Psychiatric/Behavioral: Negative for sleep disturbance. The patient is not nervous/anxious.   All other systems reviewed and are negative.    Allergies  Amoxicillin; Ampicillin; Penicillins; and Robitussin (alcohol free)  Home Medications   Current Outpatient Rx  Name  Route  Sig  Dispense  Refill  . albuterol (PROVENTIL HFA;VENTOLIN HFA) 108 (90 BASE) MCG/ACT inhaler   Inhalation   Inhale 2 puffs into the lungs every 6 (six) hours as needed for wheezing. For shortness of breath.         . nitroGLYCERIN (NITROSTAT) 0.4 MG SL tablet   Sublingual  Place 0.4 mg under the tongue every 5 (five) minutes as needed for chest pain.         Marland Kitchen HYDROcodone-acetaminophen (NORCO/VICODIN) 5-325 MG per tablet   Oral   Take 2 tablets by mouth every 4 (four) hours as needed for pain.   10 tablet   0   . HYDROcodone-homatropine (HYCODAN) 5-1.5 MG/5ML syrup   Oral   Take 5 mLs by mouth every 6 (six) hours as needed for cough.   120 mL   0   . methocarbamol  (ROBAXIN) 500 MG tablet   Oral   Take 1 tablet (500 mg total) by mouth 2 (two) times daily.   20 tablet   0   . naproxen (NAPROSYN) 500 MG tablet   Oral   Take 1 tablet (500 mg total) by mouth 2 (two) times daily with a meal.   30 tablet   0     BP 136/76  Pulse 79  Temp(Src) 98.5 F (36.9 C) (Oral)  Resp 16  SpO2 98%  Physical Exam  Nursing note and vitals reviewed. Constitutional: He is oriented to person, place, and time. He appears well-developed and well-nourished. No distress.  HENT:  Head: Normocephalic and atraumatic.  Right Ear: Tympanic membrane, external ear and ear canal normal.  Left Ear: Tympanic membrane, external ear and ear canal normal.  Nose: Mucosal edema present. No rhinorrhea.  Mouth/Throat: Uvula is midline, oropharynx is clear and moist and mucous membranes are normal. Mucous membranes are not dry and not cyanotic. No oropharyngeal exudate, posterior oropharyngeal edema, posterior oropharyngeal erythema or tonsillar abscesses.  Eyes: Conjunctivae and EOM are normal. Pupils are equal, round, and reactive to light. No scleral icterus.  Neck: Normal range of motion and full passive range of motion without pain. Neck supple. No spinous process tenderness and no muscular tenderness present. Normal range of motion present.  Cardiovascular: Normal rate, regular rhythm, normal heart sounds and intact distal pulses.  Exam reveals no gallop and no friction rub.   No murmur heard. Pulmonary/Chest: Effort normal and breath sounds normal. No respiratory distress. He has no wheezes. He has no rales. He exhibits tenderness (mild).  Abdominal: Soft. Bowel sounds are normal. He exhibits no mass. There is no hepatosplenomegaly. There is generalized tenderness (mild). There is no rigidity, no rebound, no guarding, no CVA tenderness, no tenderness at McBurney's point and negative Murphy's sign.  Musculoskeletal: Normal range of motion. He exhibits no edema.       Lumbar back:  He exhibits tenderness and pain. He exhibits no bony tenderness, no swelling, no edema, no deformity, no laceration and no spasm.  Decreased ROM when bending secondary to pain Full ROM in all other joints Pain to palpation of the paraspinal muscles of the L-spine, no spinous process tenderness  Lymphadenopathy:    He has no cervical adenopathy.  Neurological: He is alert and oriented to person, place, and time. He has normal strength and normal reflexes. No cranial nerve deficit or sensory deficit. He exhibits normal muscle tone. Coordination normal. GCS eye subscore is 4. GCS verbal subscore is 5. GCS motor subscore is 6.  Reflex Scores:      Patellar reflexes are 2+ on the right side and 2+ on the left side.      Achilles reflexes are 2+ on the right side and 2+ on the left side. Speech is clear and goal oriented, follows commands Normal strength in upper and lower extremities bilaterally, strong and equal grip strength Sensation  normal to light and sharp touch Moves extremities without ataxia, coordination intact Normal balance, normal gait   Skin: Skin is warm and dry. No rash noted. He is not diaphoretic. No erythema.  Psychiatric: He has a normal mood and affect.    ED Course  Procedures (including critical care time)  Labs Reviewed  CBC WITH DIFFERENTIAL - Abnormal; Notable for the following:    WBC 12.1 (*)    RBC 6.16 (*)    MCV 67.0 (*)    MCH 22.7 (*)    Neutro Abs 8.6 (*)    Monocytes Absolute 1.3 (*)    All other components within normal limits  COMPREHENSIVE METABOLIC PANEL - Abnormal; Notable for the following:    Glucose, Bld 104 (*)    GFR calc non Af Amer 87 (*)    All other components within normal limits  LIPASE, BLOOD   Dg Chest 2 View  12/09/2012  *RADIOLOGY REPORT*  Clinical Data: Short of breath, cough for 4 days, smoking history  CHEST - 2 VIEW  Comparison: Chest x-ray of 08/06/2011  Findings: No active infiltrate or effusion is seen.  Mediastinal  contours appear stable.  Mild peribronchial thickening may indicate bronchitis.  The heart is within normal limits in size.  No skeletal abnormality is seen.  IMPRESSION: No pneumonia.  Peribronchial thickening may indicate bronchitis.   Original Report Authenticated By: Dwyane Dee, M.D.      1. Viral syndrome   2. Bronchitis   3. Sciatica       MDM  Helaine Chess presents with flu like symptoms and sciatica.  Will obtain basic labs, hydrate the patient and provide pain control.  No neurological deficits and normal neuro exam.  Patient can walk but states is painful.  No loss of bowel or bladder control.  No concern for cauda equina.  No fever, night sweats, weight loss, h/o cancer, IVDU.  RICE protocol and pain medicine indicated and discussed with patient. Pt CXR negative for acute infiltrate. Patients symptoms are consistent with URI, likely viral etiology. Discussed that antibiotics are not indicated for viral infections. Pt will be discharged with symptomatic treatment.  Verbalizes understanding and is agreeable with plan. Pt is hemodynamically stable & in NAD prior to dc.  1. Medications: robaxin, naproxyn, vicodin, hycodan, usual home medications 2. Treatment: rest, drink plenty of fluids, gentle stretching as discussed, alternate ice and heat, begin taking mucinex for congestion 3. Follow Up: Please followup with your primary doctor for discussion of your diagnoses and further evaluation after today's visit; if you do not have a primary care doctor use the resource guide provided to find one;       Dierdre Forth, PA-C 12/09/12 1413

## 2013-02-18 ENCOUNTER — Emergency Department (HOSPITAL_COMMUNITY)
Admission: EM | Admit: 2013-02-18 | Discharge: 2013-02-18 | Disposition: A | Discharge status: Home / Self Care | Payer: Self-pay | Attending: Emergency Medicine | Admitting: Emergency Medicine

## 2013-02-18 ENCOUNTER — Encounter (HOSPITAL_COMMUNITY): Payer: Self-pay | Admitting: *Deleted

## 2013-02-18 DIAGNOSIS — F17200 Nicotine dependence, unspecified, uncomplicated: Secondary | ICD-10-CM | POA: Insufficient documentation

## 2013-02-18 DIAGNOSIS — M109 Gout, unspecified: Secondary | ICD-10-CM | POA: Insufficient documentation

## 2013-02-18 DIAGNOSIS — M129 Arthropathy, unspecified: Secondary | ICD-10-CM | POA: Insufficient documentation

## 2013-02-18 DIAGNOSIS — H53149 Visual discomfort, unspecified: Secondary | ICD-10-CM | POA: Insufficient documentation

## 2013-02-18 DIAGNOSIS — Z862 Personal history of diseases of the blood and blood-forming organs and certain disorders involving the immune mechanism: Secondary | ICD-10-CM | POA: Insufficient documentation

## 2013-02-18 DIAGNOSIS — M1990 Unspecified osteoarthritis, unspecified site: Secondary | ICD-10-CM

## 2013-02-18 DIAGNOSIS — Z8739 Personal history of other diseases of the musculoskeletal system and connective tissue: Secondary | ICD-10-CM | POA: Insufficient documentation

## 2013-02-18 DIAGNOSIS — G43909 Migraine, unspecified, not intractable, without status migrainosus: Secondary | ICD-10-CM | POA: Insufficient documentation

## 2013-02-18 DIAGNOSIS — I10 Essential (primary) hypertension: Secondary | ICD-10-CM | POA: Insufficient documentation

## 2013-02-18 DIAGNOSIS — Z79899 Other long term (current) drug therapy: Secondary | ICD-10-CM | POA: Insufficient documentation

## 2013-02-18 HISTORY — DX: Enthesopathy, unspecified: M77.9

## 2013-02-18 HISTORY — DX: Rheumatoid vasculitis with rheumatoid arthritis of unspecified site: M05.20

## 2013-02-18 LAB — URINALYSIS, ROUTINE W REFLEX MICROSCOPIC
Glucose, UA: NEGATIVE mg/dL
Hgb urine dipstick: NEGATIVE
Specific Gravity, Urine: 1.011 (ref 1.005–1.030)
Urobilinogen, UA: 1 mg/dL (ref 0.0–1.0)
pH: 7 (ref 5.0–8.0)

## 2013-02-18 MED ORDER — DIPHENHYDRAMINE HCL 50 MG/ML IJ SOLN
50.0000 mg | Freq: Once | INTRAMUSCULAR | Status: AC
  Administered 2013-02-18: 50 mg via INTRAMUSCULAR
  Filled 2013-02-18: qty 1

## 2013-02-18 MED ORDER — METOCLOPRAMIDE HCL 10 MG PO TABS
10.0000 mg | ORAL_TABLET | Freq: Once | ORAL | Status: AC
  Administered 2013-02-18: 10 mg via ORAL
  Filled 2013-02-18: qty 1

## 2013-02-18 NOTE — ED Provider Notes (Signed)
History     CSN: [id number]  Arrival date & time 02/18/13  1704   First MD Initiated Contact with Patient 02/18/13 1717      Chief Complaint  Patient presents with  . Migraine    (Consider location/radiation/quality/duration/timing/severity/associated sxs/prior treatment) HPI Comments: [name] is a 32 y/o M with PMHx of HTN, RA, anemia, arthritis, tendonitis, polysubstance abuse presenting to the ED with migraine. Patient reported that headache started approximately 2 days and has gotten progressively worse. Stated that headache waxes and wanes, stated that headache can last for about 30-40 minutes at least 2-3 times per day. Reported that intensity level of headache discomfort varies each time. Described that discomfort was more of a ice pick through the left eye yesterday and this morning, stated that the discomfort went away for a bit and now reported headache to be as if someone is stabbing him all along his left side. Stated that the headache predominantly is to the left side - affecting the left eye, left ear, left temple region, radiating to the left side of the neck. Reported that this episode of headache has been ongoing for the past 2-3 hours, stated that pain got so bad that patient had to sit down and massage his head. Has been using Motrin with minimal relief. Patient reported photophobia, phonophobia, decreased appetite. Denied nausea, vomiting, rhinorrhea, congestion, cough, chest pain, shortness of breathe, difficulty breathing, abdominal pain, diarrhea, urinary symptoms, leg pain, leg swelling, neck pain, back pain. Denied history of migraines.     PCP none - follows up with outpatient clinic Denied smoking marijuana, cocaine, heroin. Drinks alcohol occasionally.   The history is provided by the patient. No language interpreter was used.    Past Medical History  Diagnosis Date  . Hypertension   . Polysubstance abuse     alcohol,cocaine, marijuana, tobacco  . Anemia      baseline Hb- 12, microcytic  . Arthritis   . Gout   . Rheumatoid arteritis   . Tendonitis     History reviewed. No pertinent past surgical history.  No family history on file.  History  Substance Use Topics  . Smoking status: Current Every Day Smoker  . Smokeless tobacco: Not on file  . Alcohol Use: No     Comment: occasionally      Review of Systems  Constitutional: Negative for fever, chills and fatigue.  HENT: Negative for ear pain, congestion, sore throat, trouble swallowing, neck pain, neck stiffness and tinnitus.        Phonophobia   Eyes: Positive for photophobia. Negative for pain and visual disturbance.  Respiratory: Negative for cough, chest tightness and shortness of breath.   Gastrointestinal: Negative for nausea, vomiting, abdominal pain, diarrhea, constipation and blood in stool.  Genitourinary: Negative for dysuria, urgency, hematuria, decreased urine volume and difficulty urinating.  Musculoskeletal: Negative for myalgias and back pain.  Skin: Negative for rash and wound.  Neurological: Positive for headaches. Negative for dizziness, speech difficulty, weakness, light-headedness and numbness.  All other systems reviewed and are negative.    Allergies  Amoxicillin; Ampicillin; Penicillins; and Robitussin (alcohol free)  Home Medications   Current Outpatient Rx  Name  Route  Sig  Dispense  Refill  . albuterol (PROVENTIL HFA;VENTOLIN HFA) 108 (90 BASE) MCG/ACT inhaler   Inhalation   Inhale 2 puffs into the lungs every 6 (six) hours as needed for wheezing. For shortness of breath.         Marland Kitchen ibuprofen (ADVIL,MOTRIN) 200  MG tablet   Oral   Take 200 mg by mouth every 6 (six) hours as needed for pain.         . nitroGLYCERIN (NITROSTAT) 0.4 MG SL tablet   Sublingual   Place 0.4 mg under the tongue every 5 (five) minutes as needed for chest pain.           BP 150/93  Pulse 83  Temp(Src) 99 F (37.2 C) (Oral)  Resp 18  SpO2  96%  Physical Exam  Nursing note and vitals reviewed. Constitutional: He is oriented to person, place, and time. He appears well-developed and well-nourished. No distress.  Patient appears calm and collected. Patient joking around and laughing during evaluation.   HENT:  Head: Normocephalic and atraumatic.  Mouth/Throat: Oropharynx is clear and moist. No oropharyngeal exudate.  Uvula midline, symmetrical elevation.  Eyes: Conjunctivae and EOM are normal. Pupils are equal, round, and reactive to light. Right eye exhibits no discharge. Left eye exhibits no discharge.  EOMs intact without pain  Neck: Normal range of motion. Neck supple. No tracheal deviation present.  Negative nuchal rigidity Negative lymphadenopathy  Cardiovascular: Normal rate and regular rhythm.  Exam reveals no friction rub.   No murmur heard. Pulmonary/Chest: Effort normal and breath sounds normal. No respiratory distress. He has no wheezes. He has no rales.  Abdominal: Soft. Bowel sounds are normal. He exhibits no distension and no mass. There is no tenderness. There is no rebound and no guarding.  Lymphadenopathy:    He has no cervical adenopathy.  Neurological: He is alert and oriented to person, place, and time. No cranial nerve deficit. He exhibits normal muscle tone. Coordination normal.  Cranial nerves III-XII grossly intact. Steady gait, balanced.  Skin: Skin is warm and dry. No rash noted. He is not diaphoretic. No erythema.  Psychiatric: He has a normal mood and affect. His behavior is normal. Thought content normal.    ED Course  Procedures (including critical care time)  Benadryl IM and Reglan PO ordered and administered. PO fluids.   7:43PM Patient reassessed - reported improvement of pain, stated that headache was 10/10, but is now a 5/10 after administration of medications. Patient has been drinking fluids.  8:15PM Patient reassessed, given something to eat because has not eaten anything all day.  Patient feeling a lot better and ready to go home.    Labs Reviewed  URINALYSIS, ROUTINE W REFLEX MICROSCOPIC   No results found.   1. Migraine   2. HTN (hypertension)   3. Arthritis   4. Gout       MDM  I personally evaluated and examined the patient. Patient afebrile, normotensive, non-tachycardic, alert and oriented. Presenting to ED with migraine. Medication given in ED setting - patient reported alleviation of pain. Negative UA. Patient ate and drank fluids in the ED - PO challenge tolerated. Patient aseptic, non-toxic appearing, in no acute distress, no cranial nerve deficit, pain controlled in ED setting. Discharged patient. Discussed with patient to follow-up with Urgent Care Center within 2 days to be re-evaluated. Recommended patient to use over the counter Excedrin Migraine for discomfort as when needed, stay hydrated, rest. Discussed with patient to monitor symptoms and if symptoms are to worsen or change to report back to the ED. Patient agreed to plan of care, understood, all questions answered.          Raymon Mutton, PA-C 02/18/13 2044

## 2013-02-18 NOTE — ED Notes (Signed)
Pt reports migraine off and on the last few days. Denies n/v. C/o sensitivity to loud sounds. Denies photophobia. Sts pain is on L side of head extending into neck. No previous Hx of migraines per pt.

## 2013-02-18 NOTE — ED Provider Notes (Signed)
History/physical exam/procedure(s) were performed by non-physician practitioner and as supervising physician I was immediately available for consultation/collaboration. I have reviewed all notes and am in agreement with care and plan.   Hilario Quarry, MD 02/18/13 [phone]

## 2013-03-16 ENCOUNTER — Emergency Department (HOSPITAL_COMMUNITY)
Admission: EM | Admit: 2013-03-16 | Discharge: 2013-03-16 | Disposition: A | Discharge status: Home / Self Care | Payer: Self-pay | Attending: Emergency Medicine | Admitting: Emergency Medicine

## 2013-03-16 ENCOUNTER — Encounter (HOSPITAL_COMMUNITY): Payer: Self-pay

## 2013-03-16 ENCOUNTER — Emergency Department (HOSPITAL_COMMUNITY): Payer: Self-pay

## 2013-03-16 DIAGNOSIS — Z79899 Other long term (current) drug therapy: Secondary | ICD-10-CM | POA: Insufficient documentation

## 2013-03-16 DIAGNOSIS — M1990 Unspecified osteoarthritis, unspecified site: Secondary | ICD-10-CM

## 2013-03-16 DIAGNOSIS — Z8739 Personal history of other diseases of the musculoskeletal system and connective tissue: Secondary | ICD-10-CM | POA: Insufficient documentation

## 2013-03-16 DIAGNOSIS — I10 Essential (primary) hypertension: Secondary | ICD-10-CM | POA: Insufficient documentation

## 2013-03-16 DIAGNOSIS — Z862 Personal history of diseases of the blood and blood-forming organs and certain disorders involving the immune mechanism: Secondary | ICD-10-CM | POA: Insufficient documentation

## 2013-03-16 DIAGNOSIS — M25511 Pain in right shoulder: Secondary | ICD-10-CM

## 2013-03-16 DIAGNOSIS — Z880 Allergy status to penicillin: Secondary | ICD-10-CM | POA: Insufficient documentation

## 2013-03-16 DIAGNOSIS — M25519 Pain in unspecified shoulder: Secondary | ICD-10-CM | POA: Insufficient documentation

## 2013-03-16 DIAGNOSIS — M129 Arthropathy, unspecified: Secondary | ICD-10-CM | POA: Insufficient documentation

## 2013-03-16 DIAGNOSIS — F17200 Nicotine dependence, unspecified, uncomplicated: Secondary | ICD-10-CM | POA: Insufficient documentation

## 2013-03-16 MED ORDER — OXYCODONE-ACETAMINOPHEN 5-325 MG PO TABS: 1.0000 | ORAL_TABLET | Freq: Four times a day (QID) | ORAL | Status: DC | PRN

## 2013-03-16 MED ORDER — IBUPROFEN 600 MG PO TABS: 600.0000 mg | ORAL_TABLET | Freq: Three times a day (TID) | ORAL | Status: DC | PRN

## 2013-03-16 MED ORDER — HYDROMORPHONE HCL PF 1 MG/ML IJ SOLN
1.0000 mg | Freq: Once | INTRAMUSCULAR | Status: AC
  Administered 2013-03-16: 1 mg via INTRAMUSCULAR
  Filled 2013-03-16: qty 1

## 2013-03-16 NOTE — ED Notes (Signed)
Paatient c/o right shoulder pain x 3 days .Patient denies any injury. Patient wearing a sling to the right shoulder.

## 2013-03-16 NOTE — ED Notes (Signed)
Patient complains of right shoulder pain. States that it has been a few months since he had shoulder pain. Denies any injury last time he was here.

## 2013-03-16 NOTE — ED Provider Notes (Signed)
History     CSN: [id number]  Arrival date & time 03/16/13  3086   First MD Initiated Contact with Patient 03/16/13 [phone]      Chief Complaint  Patient presents with  . Shoulder Pain    (Consider location/radiation/quality/duration/timing/severity/associated sxs/prior treatment) Patient is a 32 y.o. male presenting with shoulder pain. The history is provided by the patient.  Shoulder Pain  pt with hx gout and ?RA c/o right shoulder pain for the past few days. Constant. Dull. Moderate/severe. Worse w palpation and movement. No trauma or fall. No redness. No fever/chills. No neck or radicular pain. w gout, states that has only been in feet, states RA has affected other joints. Denies fever or chills. Tried aleve without relief.     Past Medical History  Diagnosis Date  . Hypertension   . Polysubstance abuse     alcohol,cocaine, marijuana, tobacco  . Anemia     baseline Hb- 12, microcytic  . Arthritis   . Gout   . Rheumatoid arteritis   . Tendonitis     History reviewed. No pertinent past surgical history.  Family History  Problem Relation Age of Onset  . Hypertension Mother   . Hypertension Father     History  Substance Use Topics  . Smoking status: Current Every Day Smoker -- 1.00 packs/day    Types: Cigarettes  . Smokeless tobacco: Never Used  . Alcohol Use: Yes     Comment: occasionally      Review of Systems  Constitutional: Negative for fever and chills.  HENT: Negative for neck pain.   Cardiovascular: Negative for leg swelling.  Genitourinary: Negative for dysuria.  Musculoskeletal: Negative for back pain and joint swelling.  Skin: Negative for rash.  Neurological: Negative for weakness and numbness.    Allergies  Amoxicillin; Ampicillin; Penicillins; and Robitussin (alcohol free)  Home Medications   Current Outpatient Rx  Name  Route  Sig  Dispense  Refill  . ibuprofen (ADVIL,MOTRIN) 200 MG tablet   Oral   Take 200 mg by mouth every 6 (six)  hours as needed for pain.         Marland Kitchen albuterol (PROVENTIL HFA;VENTOLIN HFA) 108 (90 BASE) MCG/ACT inhaler   Inhalation   Inhale 2 puffs into the lungs every 6 (six) hours as needed for wheezing. For shortness of breath.         . nitroGLYCERIN (NITROSTAT) 0.4 MG SL tablet   Sublingual   Place 0.4 mg under the tongue every 5 (five) minutes as needed for chest pain.           BP 139/95  Pulse 84  Temp(Src) 98.3 F (36.8 C) (Oral)  Resp 18  Ht 5\' 7"  (1.702 m)  Wt 228 lb 3.2 oz (103.511 kg)  BMI 35.73 kg/m2  SpO2 96%  Physical Exam  Nursing note and vitals reviewed. Constitutional: He is oriented to person, place, and time. He appears well-developed and well-nourished. No distress.  HENT:  Head: Atraumatic.  Eyes: Conjunctivae are normal. No scleral icterus.  Neck: Neck supple. No tracheal deviation present.  Cardiovascular: Normal rate, regular rhythm, normal heart sounds and intact distal pulses.   Pulmonary/Chest: Effort normal and breath sounds normal. No accessory muscle usage. No respiratory distress.  Abdominal: He exhibits no distension. There is no tenderness.  Musculoskeletal: Normal range of motion. He exhibits no edema.  Tenderness right shoulder. Pain w int/ext rotations, lateral abduction. No visible swelling, no increased warmth or erythema. No pain w gentle, passive  rom, no findings septic joint. Distal pulses palp.  Ct spine non tender, aligned. Good rom neck without pain or radicular symptoms.   Neurological: He is alert and oriented to person, place, and time.  RUE, radial, median, ulnar n fxn intact.   Skin: Skin is warm and dry.  Psychiatric: He has a normal mood and affect.    ED Course  Procedures (including critical care time)     MDM  Pt has ride, does not have to drive.   Dilaudid 1 mg im.  Xr.  Reviewed nursing notes and prior charts for additional history.   Xray neg, discussed w pt.  Pt comfortable.  Will refer to ortho f/u, pcp  f/u re recheck bp.         Suzi Roots, MD 03/17/13 1113

## 2013-04-13 ENCOUNTER — Emergency Department (HOSPITAL_COMMUNITY)
Admission: EM | Admit: 2013-04-13 | Discharge: 2013-04-13 | Discharge status: Against Medical Advice | Payer: Self-pay | Attending: Emergency Medicine | Admitting: Emergency Medicine

## 2013-04-13 ENCOUNTER — Encounter (HOSPITAL_COMMUNITY): Payer: Self-pay | Admitting: Emergency Medicine

## 2013-04-13 DIAGNOSIS — F17200 Nicotine dependence, unspecified, uncomplicated: Secondary | ICD-10-CM | POA: Insufficient documentation

## 2013-04-13 DIAGNOSIS — K089 Disorder of teeth and supporting structures, unspecified: Secondary | ICD-10-CM | POA: Insufficient documentation

## 2013-04-13 DIAGNOSIS — I10 Essential (primary) hypertension: Secondary | ICD-10-CM | POA: Insufficient documentation

## 2013-04-13 NOTE — ED Notes (Signed)
PT. REPORTS RIGHT LOWER MOLAR PAIN ONSET LAST NIGHT .

## 2014-02-25 ENCOUNTER — Encounter (HOSPITAL_COMMUNITY): Payer: Self-pay | Admitting: Emergency Medicine

## 2014-02-25 ENCOUNTER — Emergency Department (HOSPITAL_COMMUNITY)
Admission: EM | Admit: 2014-02-25 | Discharge: 2014-02-26 | Disposition: A | Discharge status: Home / Self Care | Payer: Self-pay | Attending: Emergency Medicine | Admitting: Emergency Medicine

## 2014-02-25 ENCOUNTER — Other Ambulatory Visit: Payer: Self-pay

## 2014-02-25 ENCOUNTER — Emergency Department (HOSPITAL_COMMUNITY): Payer: Self-pay

## 2014-02-25 ENCOUNTER — Emergency Department (HOSPITAL_COMMUNITY): Payer: Medicaid Other

## 2014-02-25 DIAGNOSIS — R079 Chest pain, unspecified: Secondary | ICD-10-CM | POA: Insufficient documentation

## 2014-02-25 DIAGNOSIS — F1410 Cocaine abuse, uncomplicated: Secondary | ICD-10-CM | POA: Insufficient documentation

## 2014-02-25 DIAGNOSIS — M069 Rheumatoid arthritis, unspecified: Secondary | ICD-10-CM | POA: Insufficient documentation

## 2014-02-25 DIAGNOSIS — F17200 Nicotine dependence, unspecified, uncomplicated: Secondary | ICD-10-CM | POA: Insufficient documentation

## 2014-02-25 DIAGNOSIS — I10 Essential (primary) hypertension: Secondary | ICD-10-CM | POA: Insufficient documentation

## 2014-02-25 DIAGNOSIS — Z8639 Personal history of other endocrine, nutritional and metabolic disease: Secondary | ICD-10-CM | POA: Insufficient documentation

## 2014-02-25 DIAGNOSIS — Z862 Personal history of diseases of the blood and blood-forming organs and certain disorders involving the immune mechanism: Secondary | ICD-10-CM | POA: Insufficient documentation

## 2014-02-25 DIAGNOSIS — R0602 Shortness of breath: Secondary | ICD-10-CM | POA: Insufficient documentation

## 2014-02-25 DIAGNOSIS — Z880 Allergy status to penicillin: Secondary | ICD-10-CM | POA: Insufficient documentation

## 2014-02-25 LAB — RAPID URINE DRUG SCREEN, HOSP PERFORMED
AMPHETAMINES: NOT DETECTED
Barbiturates: NOT DETECTED
Benzodiazepines: NOT DETECTED
Cocaine: POSITIVE — AB
OPIATES: NOT DETECTED
TETRAHYDROCANNABINOL: POSITIVE — AB

## 2014-02-25 LAB — URINALYSIS, ROUTINE W REFLEX MICROSCOPIC
BILIRUBIN URINE: NEGATIVE
GLUCOSE, UA: NEGATIVE mg/dL
HGB URINE DIPSTICK: NEGATIVE
KETONES UR: 15 mg/dL — AB
NITRITE: NEGATIVE
PH: 6.5 (ref 5.0–8.0)
Protein, ur: NEGATIVE mg/dL
SPECIFIC GRAVITY, URINE: 1.024 (ref 1.005–1.030)
Urobilinogen, UA: 1 mg/dL (ref 0.0–1.0)

## 2014-02-25 LAB — TROPONIN I: Troponin I: <0.3 ng/mL (ref <0.30)

## 2014-02-25 LAB — BASIC METABOLIC PANEL
BUN: 18 mg/dL (ref 6–23)
CALCIUM: 9.3 mg/dL (ref 8.4–10.5)
CO2: 23 mEq/L (ref 19–32)
CREATININE: 1.11 mg/dL (ref 0.50–1.35)
Chloride: 104 mEq/L (ref 96–112)
GFR calc non Af Amer: 87 mL/min — ABNORMAL LOW (ref >90)
Glucose, Bld: 127 mg/dL — ABNORMAL HIGH (ref 70–99)
Potassium: 4.1 mEq/L (ref 3.7–5.3)
Sodium: 140 mEq/L (ref 137–147)

## 2014-02-25 LAB — CBC
HEMATOCRIT: 38.9 % — AB (ref 39.0–52.0)
Hemoglobin: 13.3 g/dL (ref 13.0–17.0)
MCH: 23.1 pg — ABNORMAL LOW (ref 26.0–34.0)
MCHC: 34.2 g/dL (ref 30.0–36.0)
MCV: 67.5 fL — AB (ref 78.0–100.0)
PLATELETS: 253 10*3/uL (ref 150–400)
RBC: 5.76 MIL/uL (ref 4.22–5.81)
RDW: 14.2 % (ref 11.5–15.5)
WBC: 10.4 10*3/uL (ref 4.0–10.5)

## 2014-02-25 LAB — URINE MICROSCOPIC-ADD ON

## 2014-02-25 LAB — I-STAT TROPONIN, ED: Troponin i, poc: 0 ng/mL (ref 0.00–0.08)

## 2014-02-25 MED ORDER — DIAZEPAM 5 MG PO TABS: 5.0000 mg | ORAL_TABLET | Freq: Two times a day (BID) | ORAL | Status: DC

## 2014-02-25 MED ORDER — HYDROMORPHONE HCL PF 1 MG/ML IJ SOLN
1.0000 mg | INTRAMUSCULAR | Status: AC | PRN
  Administered 2014-02-25 (×2): 1 mg via INTRAVENOUS
  Filled 2014-02-25 (×2): qty 1

## 2014-02-25 MED ORDER — NAPROXEN 500 MG PO TABS: 500.0000 mg | ORAL_TABLET | Freq: Two times a day (BID) | ORAL | Status: DC

## 2014-02-25 MED ORDER — KETOROLAC TROMETHAMINE 30 MG/ML IJ SOLN
30.0000 mg | Freq: Once | INTRAMUSCULAR | Status: AC
  Administered 2014-02-25: 30 mg via INTRAVENOUS
  Filled 2014-02-25: qty 1

## 2014-02-25 MED ORDER — IOHEXOL 350 MG/ML SOLN
100.0000 mL | Freq: Once | INTRAVENOUS | Status: AC | PRN
  Administered 2014-02-25: 100 mL via INTRAVENOUS

## 2014-02-25 MED ORDER — ONDANSETRON HCL 4 MG/2ML IJ SOLN
4.0000 mg | Freq: Once | INTRAMUSCULAR | Status: AC
  Administered 2014-02-25: 4 mg via INTRAVENOUS
  Filled 2014-02-25: qty 2

## 2014-02-25 MED ORDER — DIAZEPAM 5 MG PO TABS
10.0000 mg | ORAL_TABLET | Freq: Once | ORAL | Status: AC
  Administered 2014-02-25: 10 mg via ORAL
  Filled 2014-02-25: qty 2

## 2014-02-25 MED ORDER — MORPHINE SULFATE 4 MG/ML IJ SOLN
4.0000 mg | Freq: Once | INTRAMUSCULAR | Status: AC
  Administered 2014-02-25: 4 mg via INTRAVENOUS
  Filled 2014-02-25: qty 1

## 2014-02-25 NOTE — Discharge Instructions (Signed)
Chest Pain (Nonspecific) °It is often hard to give a specific diagnosis for the cause of chest pain. There is always a chance that your pain could be related to something serious, such as a heart attack or a blood clot in the lungs. You need to follow up with your caregiver for further evaluation. °CAUSES  °· Heartburn. °· Pneumonia or bronchitis. °· Anxiety or stress. °· Inflammation around your heart (pericarditis) or lung (pleuritis or pleurisy). °· A blood clot in the lung. °· A collapsed lung (pneumothorax). It can develop suddenly on its own (spontaneous pneumothorax) or from injury (trauma) to the chest. °· Shingles infection (herpes zoster virus). °The chest wall is composed of bones, muscles, and cartilage. Any of these can be the source of the pain. °· The bones can be bruised by injury. °· The muscles or cartilage can be strained by coughing or overwork. °· The cartilage can be affected by inflammation and become sore (costochondritis). °DIAGNOSIS  °Lab tests or other studies, such as X-rays, electrocardiography, stress testing, or cardiac imaging, may be needed to find the cause of your pain.  °TREATMENT  °· Treatment depends on what may be causing your chest pain. Treatment may include: °· Acid blockers for heartburn. °· Anti-inflammatory medicine. °· Pain medicine for inflammatory conditions. °· Antibiotics if an infection is present. °· You may be advised to change lifestyle habits. This includes stopping smoking and avoiding alcohol, caffeine, and chocolate. °· You may be advised to keep your head raised (elevated) when sleeping. This reduces the chance of acid going backward from your stomach into your esophagus. °· Most of the time, nonspecific chest pain will improve within 2 to 3 days with rest and mild pain medicine. °HOME CARE INSTRUCTIONS  °· If antibiotics were prescribed, take your antibiotics as directed. Finish them even if you start to feel better. °· For the next few days, avoid physical  activities that bring on chest pain. Continue physical activities as directed. °· Do not smoke. °· Avoid drinking alcohol. °· Only take over-the-counter or prescription medicine for pain, discomfort, or fever as directed by your caregiver. °· Follow your caregiver's suggestions for further testing if your chest pain does not go away. °· Keep any follow-up appointments you made. If you do not go to an appointment, you could develop lasting (chronic) problems with pain. If there is any problem keeping an appointment, you must call to reschedule. °SEEK MEDICAL CARE IF:  °· You think you are having problems from the medicine you are taking. Read your medicine instructions carefully. °· Your chest pain does not go away, even after treatment. °· You develop a rash with blisters on your chest. °SEEK IMMEDIATE MEDICAL CARE IF:  °· You have increased chest pain or pain that spreads to your arm, neck, jaw, back, or abdomen. °· You develop shortness of breath, an increasing cough, or you are coughing up blood. °· You have severe back or abdominal pain, feel nauseous, or vomit. °· You develop severe weakness, fainting, or chills. °· You have a fever. °THIS IS AN EMERGENCY. Do not wait to see if the pain will go away. Get medical help at once. Call your local emergency services (911 in [place] Do not drive yourself to the hospital. °MAKE SURE YOU:  °· Understand these instructions. °· Will watch your condition. °· Will get help right away if you are not doing well or get worse. °Document Released: 07/17/2005 Document Revised: 12/30/2011 Document Reviewed: 05/12/2008 °[employer]® Patient Information ©2014 [employer]   [employer]. ° °

## 2014-02-25 NOTE — ED Notes (Signed)
Called CT and they said Rhett was next on their list

## 2014-02-25 NOTE — ED Notes (Signed)
Pt reports that he started having midsternal chest pain yesterday at work. 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 States he has been seen for the same in the past, and stayed in the hospital over night.

## 2014-02-25 NOTE — ED Provider Notes (Signed)
CSN: [id number]     Arrival date & time 02/25/14  1517 History   First MD Initiated Contact with Patient 02/25/14 1809     Chief Complaint  Patient presents with  . Chest Pain     (Consider location/radiation/quality/duration/timing/severity/associated sxs/prior Treatment) HPI Comments: Patient is a 33 year old male with history of hypertension, polysubstance abuse, anemia, rheumatoid arthritis who presents today with chest pain. He reports it is a sharp pain in his left chest which is worse with movement. The pain does not radiate. He has not done anything to improve his pain. He has associated shortness of breath. He denies cocaine use, stating it has been 6 months since his last use. He denies any IV drug abuse. No rash. He denies nausea, vomiting, diaphoresis.   The history is provided by the patient. No language interpreter was used.    Past Medical History  Diagnosis Date  . Hypertension   . Polysubstance abuse     alcohol,cocaine, marijuana, tobacco  . Anemia     baseline Hb- 12, microcytic  . Arthritis   . Gout   . Rheumatoid arteritis   . Tendonitis    History reviewed. No pertinent past surgical history. Family History  Problem Relation Age of Onset  . Hypertension Mother   . Hypertension Father    History  Substance Use Topics  . Smoking status: Current Every Day Smoker -- 1.00 packs/day    Types: Cigarettes  . Smokeless tobacco: Never Used  . Alcohol Use: Yes     Comment: occasionally    Review of Systems  Constitutional: Negative for fever and diaphoresis.  Respiratory: Positive for shortness of breath.   Cardiovascular: Positive for chest pain.  Gastrointestinal: Negative for nausea, vomiting and abdominal pain.  All other systems reviewed and are negative.     Allergies  Amoxicillin; Ampicillin; Penicillins; and Robitussin (alcohol free)  Home Medications   Prior to Admission medications   Medication Sig Start Date End Date Taking? Authorizing  Provider  albuterol (PROVENTIL HFA;VENTOLIN HFA) 108 (90 BASE) MCG/ACT inhaler Inhale 2 puffs into the lungs every 6 (six) hours as needed for wheezing. For shortness of breath.   Yes Historical Provider, MD  ibuprofen (ADVIL,MOTRIN) 200 MG tablet Take 800 mg by mouth every 6 (six) hours as needed for mild pain.    Yes Historical Provider, MD  nitroGLYCERIN (NITROSTAT) 0.4 MG SL tablet Place 0.4 mg under the tongue every 5 (five) minutes as needed for chest pain.   Yes Historical Provider, MD   BP 142/92  Pulse 84  Temp(Src) 98.5 F (36.9 C) (Oral)  Resp 22  Ht 5\' 7"  (1.702 m)  Wt 215 lb (97.523 kg)  BMI 33.67 kg/m2  SpO2 99% Physical Exam  Nursing note and vitals reviewed. Constitutional: He is oriented to person, place, and time. He appears well-developed and well-nourished. No distress.  NAD  HENT:  Head: Normocephalic and atraumatic.  Right Ear: External ear normal.  Left Ear: External ear normal.  Nose: Nose normal.  Eyes: Conjunctivae are normal.  Neck: Normal range of motion. No tracheal deviation present.  Cardiovascular: Normal rate, regular rhythm, normal heart sounds and intact distal pulses.   Pulses:      Radial pulses are 2+ on the right side, and 2+ on the left side.       Posterior tibial pulses are 2+ on the right side, and 2+ on the left side.  Pulmonary/Chest: Effort normal and breath sounds normal. No stridor. He  exhibits no tenderness and no bony tenderness.  Abdominal: Soft. He exhibits no distension. There is no tenderness.  Musculoskeletal: Normal range of motion.  Neurological: He is alert and oriented to person, place, and time.  Skin: Skin is warm and dry. He is not diaphoretic.  Psychiatric: He has a normal mood and affect. His behavior is normal.    ED Course  Procedures (including critical care time) Labs Review Labs Reviewed  BASIC METABOLIC PANEL - Abnormal; Notable for the following:    Glucose, Bld 127 (*)    GFR calc non Af Amer 87 (*)     All other components within normal limits  CBC - Abnormal; Notable for the following:    HCT 38.9 (*)    MCV 67.5 (*)    MCH 23.1 (*)    All other components within normal limits  URINE RAPID DRUG SCREEN (HOSP PERFORMED) - Abnormal; Notable for the following:    Cocaine POSITIVE (*)    Tetrahydrocannabinol POSITIVE (*)    All other components within normal limits  URINALYSIS, ROUTINE W REFLEX MICROSCOPIC - Abnormal; Notable for the following:    Ketones, ur 15 (*)    Leukocytes, UA TRACE (*)    All other components within normal limits  URINE CULTURE  TROPONIN I  URINE MICROSCOPIC-ADD ON  Rosezena Sensor, ED    Imaging Review  Dg Chest 2 View  02/25/2014   CLINICAL DATA:  Chest pain for 1 day.  EXAM: CHEST  2 VIEW  COMPARISON:  12/09/2012  FINDINGS: Two views of the chest demonstrate clear lungs. Heart and mediastinum are within normal limits. No acute bone abnormality. The trachea is midline.  IMPRESSION: No acute cardiopulmonary disease.   Electronically Signed   By: Richarda Overlie M.D.   On: 02/25/2014 17:30   Ct Angio Chest Pe W/cm &/or Wo Cm  02/25/2014   CLINICAL DATA:  Chest pain and shortness of breath  EXAM: CT ANGIOGRAPHY CHEST WITH CONTRAST  TECHNIQUE: Multidetector CT imaging of the chest was performed using the standard protocol during bolus administration of intravenous contrast. Multiplanar CT image reconstructions and MIPs were obtained to evaluate the vascular anatomy.  CONTRAST:  OMNIPAQUE IOHEXOL 350 MG/ML SOLN  COMPARISON:  None.  FINDINGS: THORACIC INLET/BODY WALL:  No acute abnormality.  MEDIASTINUM:  Normal heart size. No pericardial effusion. No acute vascular abnormality, including pulmonary embolism. Limited systemic arterial opacification. No adenopathy.  LUNG WINDOWS:  No consolidation.  No effusion.  No suspicious pulmonary nodule.  UPPER ABDOMEN:  No acute findings.  OSSEOUS:  No acute fracture.  No suspicious lytic or blastic lesions.  Review of the MIP  images confirms the above findings.  IMPRESSION: Negative for pulmonary embolism or other acute intrathoracic abnormality.   Electronically Signed   By: Tiburcio Pea M.D.   On: 02/25/2014 23:57     EKG Interpretation   Date/Time:  Friday Feb 25 2014 21:04:44 EDT Ventricular Rate:  63 PR Interval:  165 QRS Duration: 85 QT Interval:  401 QTC Calculation: 410 R Axis:   62 Text Interpretation:  Age not entered, assumed to be  33 years old for  purpose of ECG interpretation Sinus rhythm ED PHYSICIAN INTERPRETATION  AVAILABLE IN [facility] Confirmed by TEST, Record ([id number]) on  02/27/2014 12:39:57 PM      MDM   Final diagnoses:  Chest pain  Cocaine abuse   Patient is to be discharged with recommendation to follow up with PCP in regards to today's  hospital visit. Chest pain is not likely of cardiac or pulmonary etiology d/t presentation, negative CT angio of chest, VSS, no tracheal deviation, no JVD or new murmur, RRR, breath sounds equal bilaterally, EKG without acute abnormalities, negative troponin x 2, and negative CXR. Pt has been advised to return to the ED is CP becomes exertional, associated with diaphoresis or nausea, radiates to left jaw/arm, worsens or becomes concerning in any way. Pt appears reliable for follow up and is agreeable to discharge.   Case has been discussed with Dr. Lynelle Doctor who agrees with the above plan to discharge.    Mora Bellman, PA-C 02/28/14 1151

## 2014-02-25 NOTE — ED Notes (Signed)
IV team RN deaccessed PT's dialysis AV access

## 2014-02-26 NOTE — ED Provider Notes (Signed)
Medical screening examination/treatment/procedure(s) were performed by non-physician practitioner and as supervising physician I was immediately available for consultation/collaboration.   EKG Interpretation   Date/Time:  Friday Feb 25 2014 15:20:36 EDT Ventricular Rate:  90 PR Interval:  152 QRS Duration: 82 QT Interval:  348 QTC Calculation: 425 R Axis:   66 Text Interpretation:  Normal sinus rhythm Normal ECG No significant change  since last tracing 06 Aug 2011 Confirmed by [facility]  MD-I, Remo Kirschenmann ([id number]) on  02/25/2014 7:43:34 PM      Devoria Albe, MD, Franz Dell, MD 02/26/14 1105

## 2014-02-26 NOTE — ED Provider Notes (Signed)
The patient's chest CT angiography is negative for PE  Arman Filter, NP 02/26/14 2130

## 2014-02-27 LAB — URINE CULTURE
COLONY COUNT: NO GROWTH
CULTURE: NO GROWTH

## 2014-03-01 NOTE — ED Provider Notes (Signed)
See prior note   Ward Givens, MD 03/01/14 1456

## 2014-04-11 ENCOUNTER — Encounter (HOSPITAL_COMMUNITY): Payer: Self-pay | Admitting: Emergency Medicine

## 2014-04-11 ENCOUNTER — Emergency Department (HOSPITAL_COMMUNITY)
Admission: EM | Admit: 2014-04-11 | Discharge: 2014-04-11 | Disposition: A | Discharge status: Home / Self Care | Payer: Medicaid Other | Attending: Emergency Medicine | Admitting: Emergency Medicine

## 2014-04-11 DIAGNOSIS — Z791 Long term (current) use of non-steroidal anti-inflammatories (NSAID): Secondary | ICD-10-CM | POA: Insufficient documentation

## 2014-04-11 DIAGNOSIS — M129 Arthropathy, unspecified: Secondary | ICD-10-CM | POA: Insufficient documentation

## 2014-04-11 DIAGNOSIS — F17200 Nicotine dependence, unspecified, uncomplicated: Secondary | ICD-10-CM | POA: Insufficient documentation

## 2014-04-11 DIAGNOSIS — Z79899 Other long term (current) drug therapy: Secondary | ICD-10-CM | POA: Insufficient documentation

## 2014-04-11 DIAGNOSIS — M109 Gout, unspecified: Secondary | ICD-10-CM | POA: Insufficient documentation

## 2014-04-11 DIAGNOSIS — Z880 Allergy status to penicillin: Secondary | ICD-10-CM | POA: Insufficient documentation

## 2014-04-11 DIAGNOSIS — Z862 Personal history of diseases of the blood and blood-forming organs and certain disorders involving the immune mechanism: Secondary | ICD-10-CM | POA: Insufficient documentation

## 2014-04-11 DIAGNOSIS — J029 Acute pharyngitis, unspecified: Secondary | ICD-10-CM

## 2014-04-11 DIAGNOSIS — R059 Cough, unspecified: Secondary | ICD-10-CM | POA: Insufficient documentation

## 2014-04-11 DIAGNOSIS — I10 Essential (primary) hypertension: Secondary | ICD-10-CM | POA: Insufficient documentation

## 2014-04-11 DIAGNOSIS — M779 Enthesopathy, unspecified: Secondary | ICD-10-CM | POA: Insufficient documentation

## 2014-04-11 DIAGNOSIS — R05 Cough: Secondary | ICD-10-CM | POA: Insufficient documentation

## 2014-04-11 DIAGNOSIS — M542 Cervicalgia: Secondary | ICD-10-CM | POA: Insufficient documentation

## 2014-04-11 LAB — RAPID STREP SCREEN (MED CTR MEBANE ONLY): STREPTOCOCCUS, GROUP A SCREEN (DIRECT): NEGATIVE

## 2014-04-11 MED ORDER — HYDROCODONE-ACETAMINOPHEN 5-325 MG PO TABS
1.0000 | ORAL_TABLET | Freq: Once | ORAL | Status: AC
  Administered 2014-04-11: 1 via ORAL
  Filled 2014-04-11: qty 1

## 2014-04-11 MED ORDER — NAPROXEN 500 MG PO TABS: 500.0000 mg | ORAL_TABLET | Freq: Two times a day (BID) | ORAL | Status: DC

## 2014-04-11 MED ORDER — HYDROCODONE-ACETAMINOPHEN 5-325 MG PO TABS: 1.0000 | ORAL_TABLET | ORAL | Status: DC | PRN

## 2014-04-11 MED ORDER — ALBUTEROL SULFATE HFA 108 (90 BASE) MCG/ACT IN AERS: 1.0000 | INHALATION_SPRAY | RESPIRATORY_TRACT | Status: DC | PRN

## 2014-04-11 NOTE — ED Notes (Signed)
Pt states two days ago he started having a sore throat and a HA that starts in the back of his neck and moves up his head. Pt having a hard time swallowing because his throat hurts so bad. Pt does not recall being around anyone that has been sick. Pt denies being nauseated. Pt denies diarrhea, vomiting as well

## 2014-04-11 NOTE — ED Provider Notes (Addendum)
CSN: [id number]     Arrival date & time 04/11/14  3976 History   First MD Initiated Contact with Patient 04/11/14 [phone]     Chief Complaint  Patient presents with  . Sore Throat     Patient is a 33 y.o. male presenting with pharyngitis. The history is provided by the patient.  Sore Throat This is a new problem. The current episode started 2 days ago. The problem occurs constantly. The problem has been gradually worsening. Pertinent negatives include no chest pain, no abdominal pain, no headaches and no shortness of breath. The symptoms are aggravated by swallowing. Nothing relieves the symptoms.    Past Medical History  Diagnosis Date  . Hypertension   . Polysubstance abuse     alcohol,cocaine, marijuana, tobacco  . Anemia     baseline Hb- 12, microcytic  . Arthritis   . Gout   . Rheumatoid arteritis   . Tendonitis    History reviewed. No pertinent past surgical history. Family History  Problem Relation Age of Onset  . Hypertension Mother   . Hypertension Father    History  Substance Use Topics  . Smoking status: Current Every Day Smoker -- 1.00 packs/day    Types: Cigarettes  . Smokeless tobacco: Never Used  . Alcohol Use: Yes     Comment: occasionally    Review of Systems  Constitutional: Negative for fever.  HENT: Negative for rhinorrhea and voice change.   Respiratory: Positive for cough. Negative for shortness of breath.   Cardiovascular: Negative for chest pain.  Gastrointestinal: Negative for abdominal pain.  Musculoskeletal: Positive for neck pain.  Skin: Negative for rash.  Neurological: Negative for headaches.      Allergies  Amoxicillin; Ampicillin; Penicillins; and Robitussin (alcohol free)  Home Medications   Prior to Admission medications   Medication Sig Start Date End Date Taking? Authorizing Provider  albuterol (PROVENTIL HFA;VENTOLIN HFA) 108 (90 BASE) MCG/ACT inhaler Inhale 2 puffs into the lungs every 6 (six) hours as needed for wheezing.  For shortness of breath.   Yes Historical Provider, MD  ibuprofen (ADVIL,MOTRIN) 200 MG tablet Take 800 mg by mouth every 6 (six) hours as needed for mild pain.    Yes Historical Provider, MD  nitroGLYCERIN (NITROSTAT) 0.4 MG SL tablet Place 0.4 mg under the tongue every 5 (five) minutes as needed for chest pain.   Yes Historical Provider, MD  HYDROcodone-acetaminophen (NORCO/VICODIN) 5-325 MG per tablet Take 1-2 tablets by mouth every 4 (four) hours as needed. 04/11/14   Linwood Dibbles, MD  naproxen (NAPROSYN) 500 MG tablet Take 1 tablet (500 mg total) by mouth 2 (two) times daily. 04/11/14   Linwood Dibbles, MD   BP 136/89  Pulse 70  Temp(Src) 97.9 F (36.6 C) (Oral)  Resp 20  SpO2 97% Physical Exam  Nursing note and vitals reviewed. Constitutional: No distress.  HENT:  Head: Normocephalic and atraumatic.  Right Ear: Tympanic membrane and external ear normal.  Left Ear: Tympanic membrane and external ear normal.  Mouth/Throat: Mucous membranes are normal. No oral lesions. No trismus in the jaw. No uvula swelling. No oropharyngeal exudate, posterior oropharyngeal edema or tonsillar abscesses.  Mild pharyngeal erythema   Eyes: Conjunctivae are normal. Right eye exhibits no discharge. Left eye exhibits no discharge. No scleral icterus.  Neck: Neck supple. No tracheal deviation present.  Cardiovascular: Normal rate, regular rhythm and intact distal pulses.   Pulmonary/Chest: Effort normal and breath sounds normal. No stridor. No respiratory distress. He has no wheezes.  He has no rales.  Abdominal: Soft. Bowel sounds are normal. He exhibits no distension. There is no tenderness. There is no rebound and no guarding.  Musculoskeletal: He exhibits no edema and no tenderness.  Neurological: He is alert. He has normal strength. No cranial nerve deficit (no facial droop, extraocular movements intact, no slurred speech) or sensory deficit. He exhibits normal muscle tone. He displays no seizure activity.  Coordination normal.  Skin: Skin is warm and dry. No rash noted. He is not diaphoretic.  Psychiatric: He has a normal mood and affect.    ED Course  Procedures (including critical care time) Labs Review Labs Reviewed  RAPID STREP SCREEN  CULTURE, GROUP A STREP     MDM   Final diagnoses:  Pharyngitis    No abscess or edema noted on exam.  Doubt PTA, retropharyngeal abscess.Possibly viral pharyngitis. Dc home with pain meds, supportive care.  Monitor for worsening symptoms.  Follow up later in the week if not improving   Linwood Dibbles, MD 04/11/14 0851  Pt requested refill of his albuterol.  He ran out.  Linwood Dibbles, MD 04/11/14 [phone]

## 2014-04-11 NOTE — Discharge Instructions (Signed)
Sore Throat A sore throat is pain, burning, irritation, or scratchiness of the throat. There is often pain or tenderness when swallowing or talking. A sore throat may be accompanied by other symptoms, such as coughing, sneezing, fever, and swollen neck glands. A sore throat is often the first sign of another sickness, such as a cold, flu, strep throat, or mononucleosis (commonly known as mono). Most sore throats go away without medical treatment. CAUSES  The most common causes of a sore throat include:  A viral infection, such as a cold, flu, or mono.  A bacterial infection, such as strep throat, tonsillitis, or whooping cough.  Seasonal allergies.  Dryness in the air.  Irritants, such as smoke or pollution.  Gastroesophageal reflux disease (GERD). HOME CARE INSTRUCTIONS   Only take over-the-counter medicines as directed by your caregiver.  Drink enough fluids to keep your urine clear or pale yellow.  Rest as needed.  Try using throat sprays, lozenges, or sucking on hard candy to ease any pain (if older than 4 years or as directed).  Sip warm liquids, such as broth, herbal tea, or warm water with honey to relieve pain temporarily. You may also eat or drink cold or frozen liquids such as frozen ice pops.  Gargle with salt water (mix 1 tsp salt with 8 oz of water).  Do not smoke and avoid secondhand smoke.  Put a cool-mist humidifier in your bedroom at night to moisten the air. You can also turn on a hot shower and sit in the bathroom with the door closed for 5-10 minutes. SEEK IMMEDIATE MEDICAL CARE IF:  You have difficulty breathing.  You are unable to swallow fluids, soft foods, or your saliva.  You have increased swelling in the throat.  Your sore throat does not get better in 7 days.  You have nausea and vomiting.  You have a fever or persistent symptoms for more than 2-3 days.  You have a fever and your symptoms suddenly get worse. MAKE SURE YOU:   Understand  these instructions.  Will watch your condition.  Will get help right away if you are not doing well or get worse. Document Released: 11/14/2004 Document Revised: 09/23/2012 Document Reviewed: 06/14/2012 [facility] Patient Information 2015 [place], [place]. This information is not intended to replace advice given to you by your health care provider. Make sure you discuss any questions you have with your health care provider.  Pharyngitis Pharyngitis is a sore throat (pharynx). There is redness, pain, and swelling of your throat. HOME CARE   Drink enough fluids to keep your pee (urine) clear or pale yellow.  Only take medicine as told by your doctor.  You may get sick again if you do not take medicine as told. Finish your medicines, even if you start to feel better.  Do not take aspirin.  Rest.  Rinse your mouth (gargle) with salt water ( tsp of salt per 1 qt of water) every 1-2 hours. This will help the pain.  If you are not at risk for choking, you can suck on hard candy or sore throat lozenges. GET HELP IF:  You have large, tender lumps on your neck.  You have a rash.  You cough up green, yellow-brown, or bloody spit. GET HELP RIGHT AWAY IF:   You have a stiff neck.  You drool or cannot swallow liquids.  You throw up (vomit) or are not able to keep medicine or liquids down.  You have very bad pain that does not  go away with medicine.  You have problems breathing (not from a stuffy nose). MAKE SURE YOU:   Understand these instructions.  Will watch your condition.  Will get help right away if you are not doing well or get worse. Document Released: 03/25/2008 Document Revised: 07/28/2013 Document Reviewed: 06/14/2013 [facility] Patient Information 2015 [place], [place]. This information is not intended to replace advice given to you by your health care provider. Make sure you discuss any questions you have with your health care provider.

## 2014-04-11 NOTE — ED Notes (Signed)
Pt refused the wheelchair to be escorted to the front. Pt wants to walk

## 2014-04-13 LAB — CULTURE, GROUP A STREP

## 2014-09-10 ENCOUNTER — Encounter (HOSPITAL_COMMUNITY): Payer: Self-pay | Admitting: Emergency Medicine

## 2014-09-10 ENCOUNTER — Emergency Department (HOSPITAL_COMMUNITY): Payer: Medicaid Other

## 2014-09-10 ENCOUNTER — Emergency Department (HOSPITAL_COMMUNITY)
Admission: EM | Admit: 2014-09-10 | Discharge: 2014-09-10 | Disposition: A | Discharge status: Home / Self Care | Payer: Medicaid Other | Attending: Emergency Medicine | Admitting: Emergency Medicine

## 2014-09-10 DIAGNOSIS — T1490XA Injury, unspecified, initial encounter: Secondary | ICD-10-CM

## 2014-09-10 DIAGNOSIS — Y998 Other external cause status: Secondary | ICD-10-CM | POA: Insufficient documentation

## 2014-09-10 DIAGNOSIS — M069 Rheumatoid arthritis, unspecified: Secondary | ICD-10-CM | POA: Insufficient documentation

## 2014-09-10 DIAGNOSIS — X58XXXA Exposure to other specified factors, initial encounter: Secondary | ICD-10-CM | POA: Insufficient documentation

## 2014-09-10 DIAGNOSIS — Z862 Personal history of diseases of the blood and blood-forming organs and certain disorders involving the immune mechanism: Secondary | ICD-10-CM | POA: Insufficient documentation

## 2014-09-10 DIAGNOSIS — Z791 Long term (current) use of non-steroidal anti-inflammatories (NSAID): Secondary | ICD-10-CM | POA: Insufficient documentation

## 2014-09-10 DIAGNOSIS — I10 Essential (primary) hypertension: Secondary | ICD-10-CM | POA: Insufficient documentation

## 2014-09-10 DIAGNOSIS — Y9289 Other specified places as the place of occurrence of the external cause: Secondary | ICD-10-CM | POA: Insufficient documentation

## 2014-09-10 DIAGNOSIS — M1990 Unspecified osteoarthritis, unspecified site: Secondary | ICD-10-CM | POA: Insufficient documentation

## 2014-09-10 DIAGNOSIS — M109 Gout, unspecified: Secondary | ICD-10-CM | POA: Insufficient documentation

## 2014-09-10 DIAGNOSIS — Y9389 Activity, other specified: Secondary | ICD-10-CM | POA: Insufficient documentation

## 2014-09-10 DIAGNOSIS — S66911A Strain of unspecified muscle, fascia and tendon at wrist and hand level, right hand, initial encounter: Secondary | ICD-10-CM | POA: Insufficient documentation

## 2014-09-10 DIAGNOSIS — Z880 Allergy status to penicillin: Secondary | ICD-10-CM | POA: Insufficient documentation

## 2014-09-10 DIAGNOSIS — Z720 Tobacco use: Secondary | ICD-10-CM | POA: Insufficient documentation

## 2014-09-10 MED ORDER — HYDROCODONE-ACETAMINOPHEN 5-325 MG PO TABS: 1.0000 | ORAL_TABLET | Freq: Four times a day (QID) | ORAL | Status: DC | PRN

## 2014-09-10 MED ORDER — IBUPROFEN 800 MG PO TABS: 800.0000 mg | ORAL_TABLET | Freq: Three times a day (TID) | ORAL | Status: DC

## 2014-09-10 MED ORDER — HYDROCODONE-ACETAMINOPHEN 5-325 MG PO TABS
1.0000 | ORAL_TABLET | Freq: Once | ORAL | Status: AC
  Administered 2014-09-10: 1 via ORAL
  Filled 2014-09-10: qty 1

## 2014-09-10 MED ORDER — IBUPROFEN 800 MG PO TABS
800.0000 mg | ORAL_TABLET | Freq: Once | ORAL | Status: AC
  Administered 2014-09-10: 800 mg via ORAL
  Filled 2014-09-10: qty 1

## 2014-09-10 NOTE — ED Notes (Signed)
Pt from home c/o  Right wrist pain from lifting up a bucket of water. He reports he felt it pop. Swelling and tenderness noted.

## 2014-09-10 NOTE — Discharge Instructions (Signed)

## 2014-09-10 NOTE — ED Provider Notes (Signed)
CSN: [id number]     Arrival date & time 09/10/14  1707 History  This chart was scribed for a non-physician practitioner, Teressa Lower, NP working with Hurman Horn, MD by [place] Peace, ED Scribe. The patient was seen in [facility]. The patient's care was started at 6:02 PM.  Chief Complaint  Patient presents with  . Wrist Injury   The history is provided by the patient. No language interpreter was used.    HPI Comments: [name] is a 33 y.o. male who presents to the Emergency Department complaining of right wrist pain since he lifted a bucket of water yesterday and felt it "pop." He states pain continued today with associated swelling. He reports using BC last night without improvement but states he has not taken any medication for it today. He denies prior injuries to his right wrist.   Past Medical History  Diagnosis Date  . Hypertension   . Polysubstance abuse     alcohol,cocaine, marijuana, tobacco  . Anemia     baseline Hb- 12, microcytic  . Arthritis   . Gout   . Rheumatoid arteritis   . Tendonitis    History reviewed. No pertinent past surgical history. Family History  Problem Relation Age of Onset  . Hypertension Mother   . Hypertension Father    History  Substance Use Topics  . Smoking status: Current Every Day Smoker -- 1.00 packs/day    Types: Cigarettes  . Smokeless tobacco: Never Used  . Alcohol Use: Yes     Comment: occasionally    Review of Systems  Musculoskeletal:       Right wrist pain and swelling  All other systems reviewed and are negative.     Allergies  Amoxicillin; Ampicillin; Penicillins; and Robitussin (alcohol free)  Home Medications   Prior to Admission medications   Medication Sig Start Date End Date Taking? Authorizing Provider  albuterol (PROVENTIL HFA;VENTOLIN HFA) 108 (90 BASE) MCG/ACT inhaler Inhale 1-2 puffs into the lungs every 4 (four) hours as needed for wheezing or shortness of breath. For shortness of breath.  04/11/14   Linwood Dibbles, MD  HYDROcodone-acetaminophen (NORCO/VICODIN) 5-325 MG per tablet Take 1-2 tablets by mouth every 4 (four) hours as needed. 04/11/14   Linwood Dibbles, MD  ibuprofen (ADVIL,MOTRIN) 200 MG tablet Take 800 mg by mouth every 6 (six) hours as needed for mild pain.     Historical Provider, MD  naproxen (NAPROSYN) 500 MG tablet Take 1 tablet (500 mg total) by mouth 2 (two) times daily. 04/11/14   Linwood Dibbles, MD  nitroGLYCERIN (NITROSTAT) 0.4 MG SL tablet Place 0.4 mg under the tongue every 5 (five) minutes as needed for chest pain.    Historical Provider, MD   BP 140/96 mmHg  Pulse 82  Temp(Src) 98.4 F (36.9 C) (Oral)  Resp 16  SpO2 97% Physical Exam  Constitutional: He is oriented to person, place, and time. He appears well-developed and well-nourished. No distress.  HENT:  Head: Normocephalic and atraumatic.  Eyes: Conjunctivae and EOM are normal.  Neck: Normal range of motion. Neck supple. No tracheal deviation present.  Pulmonary/Chest: Effort normal. No respiratory distress.  Musculoskeletal: Normal range of motion.  Normal ROM of right wrist  Neurological: He is alert and oriented to person, place, and time.  Skin: Skin is warm and dry.  Psychiatric: He has a normal mood and affect. His behavior is normal.  Nursing note and vitals reviewed.   ED Course  Procedures (including critical care time)  DIAGNOSTIC STUDIES: Oxygen Saturation is 97% on room air, normal by my interpretation.    COORDINATION OF CARE: 6:06 PM Discussed treatment plan with patient at beside, the patient agrees with the plan and has no further questions at this time.   Labs Review Labs Reviewed - No data to display  Imaging Review Dg Wrist Complete Right  09/10/2014   CLINICAL DATA:  Picked up bucket of water yesterday and felt and pop. Right lateral wrist pain.  EXAM: RIGHT WRIST - COMPLETE 3+ VIEW  COMPARISON:  None.  FINDINGS: There is no evidence of fracture or dislocation. There is no  evidence of arthropathy or other focal bone abnormality. Soft tissues are unremarkable.  IMPRESSION: Negative.   Electronically Signed   By: Charlett Nose M.D.   On: 09/10/2014 17:59     EKG Interpretation None     Medications - No data to display   MDM   Final diagnoses:  Wrist strain, right, initial encounter    No bony abnormality noted. Will place in a velcro splint. Neurovascularly intact.  I personally performed the services described in this documentation, which was scribed in my presence. The recorded information has been reviewed and is accurate.     Teressa Lower, NP 09/10/14 1810  Hurman Horn, MD 09/15/14 [phone]

## 2014-09-30 ENCOUNTER — Emergency Department (HOSPITAL_COMMUNITY): Payer: Medicaid Other

## 2014-09-30 ENCOUNTER — Encounter (HOSPITAL_COMMUNITY): Payer: Self-pay | Admitting: Emergency Medicine

## 2014-09-30 ENCOUNTER — Emergency Department (HOSPITAL_COMMUNITY): Payer: Self-pay

## 2014-09-30 ENCOUNTER — Emergency Department (HOSPITAL_COMMUNITY)
Admission: EM | Admit: 2014-09-30 | Discharge: 2014-09-30 | Disposition: A | Discharge status: Home / Self Care | Payer: Self-pay | Attending: Emergency Medicine | Admitting: Emergency Medicine

## 2014-09-30 DIAGNOSIS — R200 Anesthesia of skin: Secondary | ICD-10-CM | POA: Insufficient documentation

## 2014-09-30 DIAGNOSIS — F1410 Cocaine abuse, uncomplicated: Secondary | ICD-10-CM | POA: Insufficient documentation

## 2014-09-30 DIAGNOSIS — R42 Dizziness and giddiness: Secondary | ICD-10-CM

## 2014-09-30 DIAGNOSIS — M109 Gout, unspecified: Secondary | ICD-10-CM | POA: Insufficient documentation

## 2014-09-30 DIAGNOSIS — R51 Headache: Secondary | ICD-10-CM | POA: Insufficient documentation

## 2014-09-30 DIAGNOSIS — I10 Essential (primary) hypertension: Secondary | ICD-10-CM | POA: Insufficient documentation

## 2014-09-30 DIAGNOSIS — M1990 Unspecified osteoarthritis, unspecified site: Secondary | ICD-10-CM | POA: Insufficient documentation

## 2014-09-30 DIAGNOSIS — Z880 Allergy status to penicillin: Secondary | ICD-10-CM | POA: Insufficient documentation

## 2014-09-30 DIAGNOSIS — Z720 Tobacco use: Secondary | ICD-10-CM | POA: Insufficient documentation

## 2014-09-30 DIAGNOSIS — M069 Rheumatoid arthritis, unspecified: Secondary | ICD-10-CM | POA: Insufficient documentation

## 2014-09-30 DIAGNOSIS — Z862 Personal history of diseases of the blood and blood-forming organs and certain disorders involving the immune mechanism: Secondary | ICD-10-CM | POA: Insufficient documentation

## 2014-09-30 DIAGNOSIS — R519 Headache, unspecified: Secondary | ICD-10-CM

## 2014-09-30 DIAGNOSIS — H538 Other visual disturbances: Secondary | ICD-10-CM

## 2014-09-30 DIAGNOSIS — F1210 Cannabis abuse, uncomplicated: Secondary | ICD-10-CM | POA: Insufficient documentation

## 2014-09-30 DIAGNOSIS — R0789 Other chest pain: Secondary | ICD-10-CM | POA: Insufficient documentation

## 2014-09-30 LAB — COMPREHENSIVE METABOLIC PANEL
ALBUMIN: 4 g/dL (ref 3.5–5.2)
ALT: 22 U/L (ref 0–53)
ANION GAP: 15 (ref 5–15)
AST: 31 U/L (ref 0–37)
Alkaline Phosphatase: 60 U/L (ref 39–117)
BUN: 15 mg/dL (ref 6–23)
CALCIUM: 9.9 mg/dL (ref 8.4–10.5)
CO2: 21 mEq/L (ref 19–32)
CREATININE: 0.98 mg/dL (ref 0.50–1.35)
Chloride: 102 mEq/L (ref 96–112)
GFR calc Af Amer: >90 mL/min (ref >90)
GFR calc non Af Amer: >90 mL/min (ref >90)
Glucose, Bld: 108 mg/dL — ABNORMAL HIGH (ref 70–99)
Potassium: 4.5 mEq/L (ref 3.7–5.3)
Sodium: 138 mEq/L (ref 137–147)
TOTAL PROTEIN: 7.1 g/dL (ref 6.0–8.3)
Total Bilirubin: 0.3 mg/dL (ref 0.3–1.2)

## 2014-09-30 LAB — CBC WITH DIFFERENTIAL/PLATELET
BASOS ABS: 0 10*3/uL (ref 0.0–0.1)
Basophils Relative: 0 % (ref 0–1)
EOS ABS: 0.1 10*3/uL (ref 0.0–0.7)
Eosinophils Relative: 1 % (ref 0–5)
HEMATOCRIT: 37.9 % — AB (ref 39.0–52.0)
Hemoglobin: 12.4 g/dL — ABNORMAL LOW (ref 13.0–17.0)
LYMPHS ABS: 3 10*3/uL (ref 0.7–4.0)
Lymphocytes Relative: 24 % (ref 12–46)
MCH: 22.3 pg — AB (ref 26.0–34.0)
MCHC: 32.7 g/dL (ref 30.0–36.0)
MCV: 68.3 fL — ABNORMAL LOW (ref 78.0–100.0)
MONO ABS: 0.6 10*3/uL (ref 0.1–1.0)
Monocytes Relative: 5 % (ref 3–12)
NEUTROS ABS: 8.7 10*3/uL — AB (ref 1.7–7.7)
Neutrophils Relative %: 70 % (ref 43–77)
Platelets: 265 10*3/uL (ref 150–400)
RBC: 5.55 MIL/uL (ref 4.22–5.81)
RDW: 14.4 % (ref 11.5–15.5)
WBC: 12.4 10*3/uL — ABNORMAL HIGH (ref 4.0–10.5)

## 2014-09-30 LAB — RAPID URINE DRUG SCREEN, HOSP PERFORMED
Amphetamines: NOT DETECTED
BENZODIAZEPINES: NOT DETECTED
Barbiturates: NOT DETECTED
COCAINE: POSITIVE — AB
OPIATES: NOT DETECTED
Tetrahydrocannabinol: POSITIVE — AB

## 2014-09-30 LAB — I-STAT TROPONIN, ED
Troponin i, poc: 0 ng/mL (ref 0.00–0.08)
Troponin i, poc: 0 ng/mL (ref 0.00–0.08)

## 2014-09-30 LAB — ETHANOL: Alcohol, Ethyl (B): <11 mg/dL (ref 0–11)

## 2014-09-30 MED ORDER — LORAZEPAM 2 MG/ML IJ SOLN
1.0000 mg | Freq: Once | INTRAMUSCULAR | Status: AC
  Administered 2014-09-30: 1 mg via INTRAVENOUS
  Filled 2014-09-30: qty 1

## 2014-09-30 MED ORDER — GADOBENATE DIMEGLUMINE 529 MG/ML IV SOLN
20.0000 mL | Freq: Once | INTRAVENOUS | Status: AC | PRN
  Administered 2014-09-30: 20 mL via INTRAVENOUS

## 2014-09-30 NOTE — Consult Note (Signed)
Referring Physician: Silverio Lay    Chief Complaint: Nausea, blurred vision, off balance   HPI:                                                                                                                                         [name] is an 33 y.o. male presenting tot he ED after having 2-3 days of blurred vision, vertigo, nausea that has been off and on. He also is complaining of abdominal pain. HE cannot give me a time of onset other than stating it started 2-3 days ago.  He did not seek medical attention as he thought this may go away on its own. He denies any elicit drug use other than THC and drinking 1-40 oz beer daily. He does endorse a mild right sided HA, which is throbbing in nature but denies history of HA.  Affect is flat and patient does not provide much information when asked about specifics of what brought him to hospital.  Denies slurred speech, focal weakness, language or visual disturbances. CT brain and cervical spine unremarkable.      Date last known well: Unable to determine Time last known well: Unable to determine tPA Given: No: last known normal 3 days ago.   Past Medical History  Diagnosis Date  . Hypertension   . Polysubstance abuse     alcohol,cocaine, marijuana, tobacco  . Anemia     baseline Hb- 12, microcytic  . Arthritis   . Gout   . Rheumatoid arteritis   . Tendonitis     History reviewed. No pertinent past surgical history.  Family History  Problem Relation Age of Onset  . Hypertension Mother   . Hypertension Father    Social History:  reports that he has been smoking Cigarettes.  He has been smoking about 1.00 pack per day. He has never used smokeless tobacco. He reports that he drinks alcohol. He reports that he uses illicit drugs (Marijuana).  Allergies:  Allergies  Allergen Reactions  . Amoxicillin Hives and Other (See Comments)    sweats  . Ampicillin Hives and Other (See Comments)    sweating  . Penicillins Hives and Other  (See Comments)    sweating  . Robitussin (Alcohol Free) [Guaifenesin] Hives    Medications:  No current facility-administered medications for this encounter.   Current Outpatient Prescriptions  Medication Sig Dispense Refill  . albuterol (PROVENTIL HFA;VENTOLIN HFA) 108 (90 BASE) MCG/ACT inhaler Inhale 1-2 puffs into the lungs every 4 (four) hours as needed for wheezing or shortness of breath. For shortness of breath. 1 Inhaler 1  . HYDROcodone-acetaminophen (NORCO/VICODIN) 5-325 MG per tablet Take 1-2 tablets by mouth every 6 (six) hours as needed. (Patient not taking: Reported on 09/30/2014) 10 tablet 0  . ibuprofen (ADVIL,MOTRIN) 800 MG tablet Take 1 tablet (800 mg total) by mouth 3 (three) times daily. (Patient not taking: Reported on 09/30/2014) 21 tablet 0  . naproxen (NAPROSYN) 500 MG tablet Take 1 tablet (500 mg total) by mouth 2 (two) times daily. (Patient not taking: Reported on 09/30/2014) 30 tablet 0  . nitroGLYCERIN (NITROSTAT) 0.4 MG SL tablet Place 0.4 mg under the tongue every 5 (five) minutes as needed for chest pain.       ROS:                                                                                                                                       History obtained from the patient  General ROS: negative for - chills, fatigue, fever, night sweats, weight gain or weight loss Psychological ROS: negative for - behavioral disorder, hallucinations, memory difficulties, mood swings or suicidal ideation Ophthalmic ROS: negative for - blurry vision, double vision, eye pain or loss of vision ENT ROS: negative for - epistaxis, nasal discharge, oral lesions, sore throat, tinnitus or vertigo Allergy and Immunology ROS: negative for - hives or itchy/watery eyes Hematological and Lymphatic ROS: negative for - bleeding problems, bruising or swollen lymph  nodes Endocrine ROS: negative for - galactorrhea, hair pattern changes, polydipsia/polyuria or temperature intolerance Respiratory ROS: negative for - cough, hemoptysis, shortness of breath or wheezing Cardiovascular ROS: negative for - chest pain, dyspnea on exertion, edema or irregular heartbeat Gastrointestinal ROS: negative for - abdominal pain, diarrhea, hematemesis, nausea/vomiting or stool incontinence Genito-Urinary ROS: negative for - dysuria, hematuria, incontinence or urinary frequency/urgency Musculoskeletal ROS: negative for - joint swelling or muscular weakness Neurological ROS: as noted in HPI Dermatological ROS: negative for rash and skin lesion changes  Neurologic Examination:                                                                                                      Blood pressure 129/86, pulse 86, temperature 98 F (36.7 C), temperature source Oral, resp. rate  16, height 5' 7.5" (1.715 m), weight 99.338 kg (219 lb), SpO2 97 %.   Physical Exam  Constitutional: He appears well-developed and well-nourished.  Psych: Affect not appropriate to situation--monotone voice and flat affect Eyes: No scleral injection HENT: No OP obstrucion Head: Normocephalic.  Cardiovascular: Normal rate and regular rhythm.  Respiratory: Effort normal and breath sounds normal.  GI: Soft. Bowel sounds are normal. No distension. There is no tenderness.  Skin: WDI  Neuro Exam: Mental Status: Alert, oriented, thought content appropriate.  Speech fluent without evidence of aphasia.  Able to follow 3 step commands without difficulty.Flat affect.  Cranial Nerves: II: Discs flat bilaterally; Visual fields grossly normal, pupils equal, round, reactive to light and accommodation III,IV, VI: ptosis not present, extra-ocular motions intact bilaterally--when looking up he shows skewed deviation of both eye L>R.  V,VII: smile symmetric, facial light touch sensation normal bilaterally VIII: hearing  normal bilaterally IX,X: gag reflex present XI: bilateral shoulder shrug XII: midline tongue extension without atrophy or fasciculations  Motor: Right : Upper extremity   5/5    Left:     Upper extremity   5/5  Lower extremity   5/5     Lower extremity   5/5 Tone and bulk:normal tone throughout; no atrophy noted Sensory: Pinprick and light touch intact throughout, bilaterally Deep Tendon Reflexes:  Right: Upper Extremity   Left: Upper extremity   biceps (C-5 to C-6) 2/4   biceps (C-5 to C-6) 2/4 tricep (C7) 2/4    triceps (C7) 2/4 Brachioradialis (C6) 2/4  Brachioradialis (C6) 2/4  Lower Extremity Lower Extremity  quadriceps (L-2 to L-4) 2/4   quadriceps (L-2 to L-4) 2/4 Achilles (S1) 2/4   Achilles (S1) 2/4  Plantars: Right: downgoing   Left: downgoing Cerebellar: normal finger-to-nose,  normal heel-to-shin test Gait: not tested.  CV: pulses palpable throughout    Lab Results: Basic Metabolic Panel: No results for input(s): NA, K, CL, CO2, GLUCOSE, BUN, CREATININE, CALCIUM, MG, PHOS in the last 168 hours.  Liver Function Tests: No results for input(s): AST, ALT, ALKPHOS, BILITOT, PROT, ALBUMIN in the last 168 hours. No results for input(s): LIPASE, AMYLASE in the last 168 hours. No results for input(s): AMMONIA in the last 168 hours.  CBC: No results for input(s): WBC, NEUTROABS, HGB, HCT, MCV, PLT in the last 168 hours.  Cardiac Enzymes: No results for input(s): CKTOTAL, CKMB, CKMBINDEX, TROPONINI in the last 168 hours.  Lipid Panel: No results for input(s): CHOL, TRIG, HDL, CHOLHDL, VLDL, LDLCALC in the last 168 hours.  CBG: No results for input(s): GLUCAP in the last 168 hours.  Microbiology: Results for orders placed or performed during the hospital encounter of 04/11/14  Rapid strep screen     Status: None   Collection Time: 04/11/14  7:30 AM  Result Value Ref Range Status   Streptococcus, Group A Screen (Direct) NEGATIVE NEGATIVE Final    Comment:  (NOTE) A Rapid Antigen test may result negative if the antigen level in the sample is below the detection level of this test. The FDA has not cleared this test as a stand-alone test therefore the rapid antigen negative result has reflexed to a Group A Strep culture.  Culture, Group A Strep     Status: None   Collection Time: 04/11/14  7:30 AM  Result Value Ref Range Status   Specimen Description THROAT  Final   Special Requests NONE  Final   Culture   Final    No Beta Hemolytic Streptococci  Isolated Performed at [facility]   Report Status 04/13/2014 FINAL  Final    Coagulation Studies: No results for input(s): LABPROT, INR in the last 72 hours.  Imaging: No results found.     Assessment and plan discussed with with attending physician and they are in agreement.    Felicie Morn PA-C [facility] [phone]  09/30/2014, 12:59 PM   Assessment: 33 y.o. male with 3 day history of nausea, blurred vision, vertigo and dizziness which is intermittent. The only significant finding on exam is skewed deviation when looking up.  With history of polysubstance abuse in past and Sx, would recommend further evaluation of intracranial vessels and brain.   Stroke Risk Factors - hypertension  Recommend: 1) MRI brain 2) MRA brain and neck --if negative no further neuro in-patient work up recommended.  --If positive, Hospitalist admit and transfer to [facility].    Patient seen and examined together with physician assistant and I concur with the assessment and plan.  Wyatt Portela, MD

## 2014-09-30 NOTE — ED Notes (Signed)
Patient is aware that we need a urine sample.    

## 2014-09-30 NOTE — ED Provider Notes (Signed)
CSN: [id number]     Arrival date & time 09/30/14  1122 History   First MD Initiated Contact with Patient 09/30/14 1156     Chief Complaint  Patient presents with  . Blurred Vision  . Diplopia  . arm tingling     (Consider location/radiation/quality/duration/timing/severity/associated sxs/prior Treatment) The history is provided by the patient.  [name] is a 33 y.o. male hx of HTN, polysubstance abuse, here with double vision and vertigo. He states that he has been having double vision for the last 2 days. Also felt that the room was spinning. Of note he's been having some tingling in the right third and fourth finger for the last month is intermittent. Denies any trouble with his speech. Denies any history of stroke in the past. He does drink alcohol daily and last drink was yesterday. He admits to marijuana use but on his chart he has abused cocaine previously as well. Has intermittent chest pain for several months and had an episode yesterday but denies any chest pain currently.    Past Medical History  Diagnosis Date  . Hypertension   . Polysubstance abuse     alcohol,cocaine, marijuana, tobacco  . Anemia     baseline Hb- 12, microcytic  . Arthritis   . Gout   . Rheumatoid arteritis   . Tendonitis    History reviewed. No pertinent past surgical history. Family History  Problem Relation Age of Onset  . Hypertension Mother   . Hypertension Father    History  Substance Use Topics  . Smoking status: Current Every Day Smoker -- 1.00 packs/day    Types: Cigarettes  . Smokeless tobacco: Never Used  . Alcohol Use: Yes     Comment: occasionally    Review of Systems  Cardiovascular: Positive for chest pain.  Neurological: Positive for dizziness and light-headedness.  All other systems reviewed and are negative.     Allergies  Amoxicillin; Ampicillin; Penicillins; and Robitussin (alcohol free)  Home Medications   Prior to Admission medications   Medication  Sig Start Date End Date Taking? Authorizing Provider  albuterol (PROVENTIL HFA;VENTOLIN HFA) 108 (90 BASE) MCG/ACT inhaler Inhale 1-2 puffs into the lungs every 4 (four) hours as needed for wheezing or shortness of breath. For shortness of breath. 04/11/14   Linwood Dibbles, MD  HYDROcodone-acetaminophen (NORCO/VICODIN) 5-325 MG per tablet Take 1-2 tablets by mouth every 6 (six) hours as needed. Patient not taking: Reported on 09/30/2014 09/10/14   Teressa Lower, NP  ibuprofen (ADVIL,MOTRIN) 800 MG tablet Take 1 tablet (800 mg total) by mouth 3 (three) times daily. Patient not taking: Reported on 09/30/2014 09/10/14   Teressa Lower, NP  naproxen (NAPROSYN) 500 MG tablet Take 1 tablet (500 mg total) by mouth 2 (two) times daily. Patient not taking: Reported on 09/30/2014 04/11/14   Linwood Dibbles, MD  nitroGLYCERIN (NITROSTAT) 0.4 MG SL tablet Place 0.4 mg under the tongue every 5 (five) minutes as needed for chest pain.    Historical Provider, MD   BP 124/70 mmHg  Pulse 76  Temp(Src) 98 F (36.7 C) (Oral)  Resp 13  Ht 5' 7.5" (1.715 m)  Wt 219 lb (99.338 kg)  BMI 33.77 kg/m2  SpO2 95% Physical Exam  Constitutional: He is oriented to person, place, and time.  Tired, dehydrated, intoxicated   HENT:  Head: Normocephalic.  Mouth/Throat: Oropharynx is clear and moist.  Eyes: Pupils are equal, round, and reactive to light.  No obvious nystagmus. When he looks up, left  eye looks to the L.   Neck: Normal range of motion. Neck supple.  Cardiovascular: Normal rate, regular rhythm and normal heart sounds.   Pulmonary/Chest: Effort normal and breath sounds normal. No respiratory distress. He has no wheezes. He has no rales.  Abdominal: Soft. Bowel sounds are normal. He exhibits no distension. There is no tenderness. There is no rebound.  Musculoskeletal: Normal range of motion.  Neurological: He is alert and oriented to person, place, and time.  Slightly dec sensation R 3rd, 4th fingers. Nl strength  throughout. CN 2-12intact. Nl finger to nose   Skin: Skin is warm and dry.  Psychiatric:  Unable   Nursing note and vitals reviewed.   ED Course  Procedures (including critical care time) Labs Review Labs Reviewed  CBC WITH DIFFERENTIAL - Abnormal; Notable for the following:    WBC 12.4 (*)    Hemoglobin 12.4 (*)    HCT 37.9 (*)    MCV 68.3 (*)    MCH 22.3 (*)    Neutro Abs 8.7 (*)    All other components within normal limits  COMPREHENSIVE METABOLIC PANEL - Abnormal; Notable for the following:    Glucose, Bld 108 (*)    All other components within normal limits  URINE RAPID DRUG SCREEN (HOSP PERFORMED) - Abnormal; Notable for the following:    Cocaine POSITIVE (*)    Tetrahydrocannabinol POSITIVE (*)    All other components within normal limits  ETHANOL  I-STAT TROPOININ, ED  I-STAT TROPOININ, ED    Imaging Review Ct Head Wo Contrast  09/30/2014   CLINICAL DATA:  33 year old male with blurred and double vision beginning 2 days ago.  EXAM: CT HEAD WITHOUT CONTRAST  TECHNIQUE: Contiguous axial images were obtained from the base of the skull through the vertex without intravenous contrast.  COMPARISON:  Prior CT scan of the head 12/20/2006  FINDINGS: Negative for acute intracranial hemorrhage, acute infarction, mass, mass effect, hydrocephalus or midline shift. Gray-white differentiation is preserved throughout. No acute soft tissue or calvarial abnormality. The globes and orbits are symmetric and unremarkable. Normal aeration of the mastoid air cells and visualized paranasal sinuses.  IMPRESSION: Negative head CT.   Electronically Signed   By: Malachy Moan M.D.   On: 09/30/2014 12:58   Ct Cervical Spine Wo Contrast  09/30/2014   CLINICAL DATA:  blurred vision, vertigo, nausea that has been off and on. He also is complaining of abdominal pain. HE cannot give me a time of onset other than stating it started 2-3 days ago. HE did not seek medical attention as he thought this may  go away on its own. He denies any elicit drug use other than THC and drinking 1-40 oz beer daily. He does endorse a mild right sided HA, which is throbbing in nature but denies history of HA, right arm numbness  EXAM: CT CERVICAL SPINE WITHOUT CONTRAST  TECHNIQUE: Multidetector CT imaging of the cervical spine was performed without intravenous contrast. Multiplanar CT image reconstructions were also generated.  COMPARISON:  None.  FINDINGS: Normal alignment. Vertebral body and disc height maintained throughout. Facets are seated. No significant osseous degenerative change. Negative for fracture. Visualized lung apices clear.  IMPRESSION: 1. Negative   Electronically Signed   By: Oley Balm M.D.   On: 09/30/2014 13:50     EKG Interpretation   Date/Time:  Friday September 30 2014 12:05:34 EST Ventricular Rate:  75 PR Interval:  175 QRS Duration: 88 QT Interval:  361 QTC Calculation: 403  R Axis:   61 Text Interpretation:  Sinus rhythm ST elev, probable normal early repol  pattern No significant change since last tracing Confirmed by Randle Shatzer  MD,  Amy Belloso ([id number]) on 09/30/2014 12:57:26 PM      MDM   Final diagnoses:  Headache  Right arm numbness    JAHSIAH CARPENTER is a 33 y.o. male here with vertigo, double vision. He has dysconjugate gaze. Will get CT head. Can be stroke vs polysubstance abuse. Will call neuro.   4:09 PM CT unremarkable. Neuro saw patient, recommend MRI. UDS + cocaine. Will get delta trop. Signed out to Dr. Loretha Stapler to f/u delta trop and MRI.    Richardean Canal, MD 09/30/14 [phone]

## 2014-09-30 NOTE — ED Notes (Signed)
MD at bedside. NEURO AT BS

## 2014-09-30 NOTE — ED Provider Notes (Signed)
6:31 PM Care assumed from Dr. Silverio Lay at 16:00.  Plan for MRI and delta troponin, discharge if negative.  MR showed no acute stroke.  I discussed findings with Dr. Roseanne Reno, including possibility of right vertebral ostial stenosis.  He did not feel that this needed further hospital based workup.  Plan to DC pt home with Neuro follow up.  Have given him strict driving and dizziness precautions .   8:17 PM he ambulated independently without difficulty.  DC home.    Clinical Impression: 1. Headache   2. Right arm numbness   3. Blurred vision   4. Dizziness   5. Vertigo       Warnell Forester, MD 09/30/14 2017

## 2014-09-30 NOTE — ED Notes (Signed)
Pt c/o blurred vision and double vision that started two days ago.  Pt also c/o right arm tinging that has been going on for about month.

## 2014-09-30 NOTE — ED Notes (Signed)
Patient transported to MRI 

## 2014-09-30 NOTE — ED Notes (Signed)
Patient transported to CT 

## 2015-10-29 IMAGING — CT CT HEAD W/O CM
2 series · 17 of 30 positions shown, 20 images · non-contrast
Comparison: Prior CT scan of the head 12/20/2006

CLINICAL DATA: 33-year-old male with blurred and double vision
beginning 2 days ago.

EXAM:
CT HEAD WITHOUT CONTRAST
TECHNIQUE: Contiguous axial images were obtained from the base of the skull
through the vertex without intravenous contrast.

[Series 2: head w/o · axial · non-contrast · 0.46mm/px · z∈[-95,+39]mm · 10 of 34 slices shown, 13 images]
[im 4/34  brain]
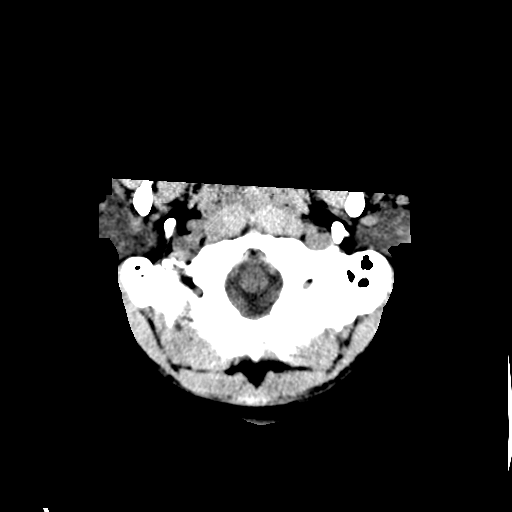
[im 4/34  bone]
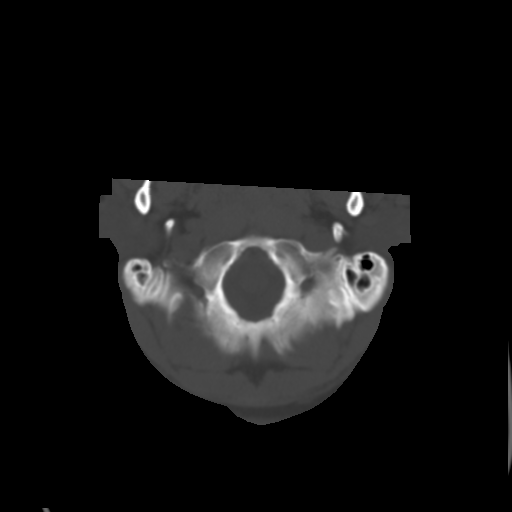
[im 7/34  brain]
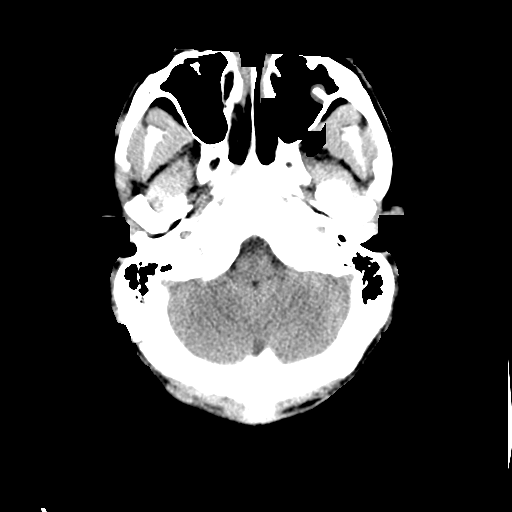
[im 10/34  brain]
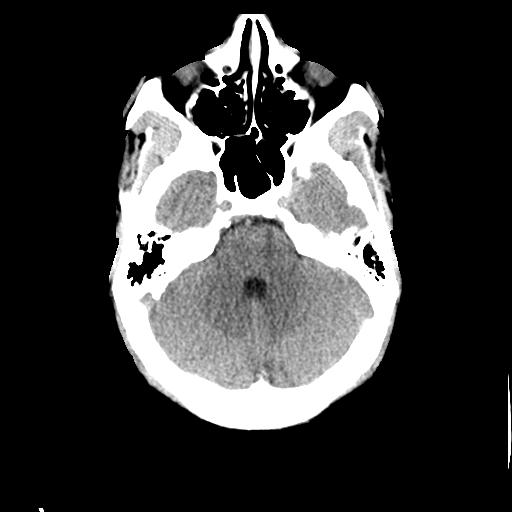
[im 13/34  brain]
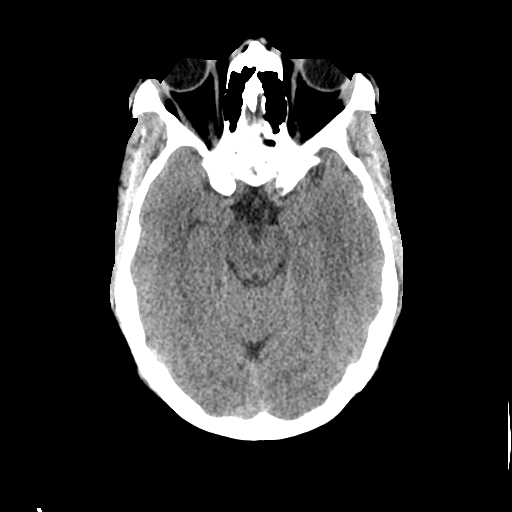
[im 16/34  brain]
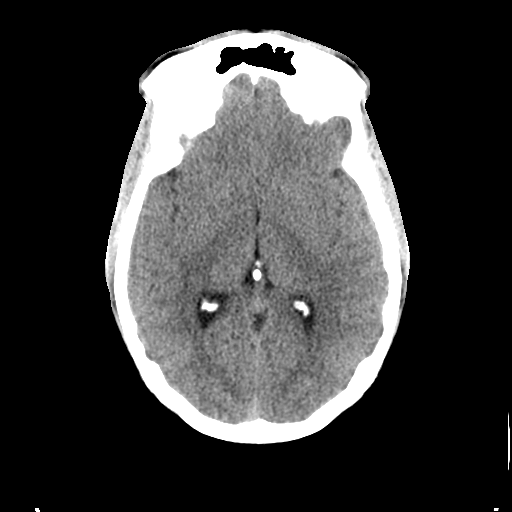
[im 16/34  bone]
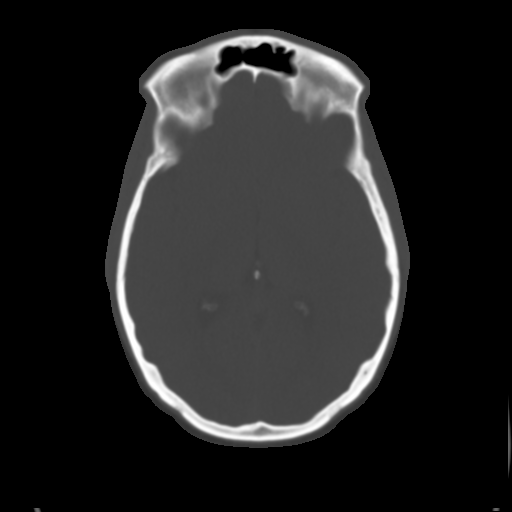
[im 19/34  brain]
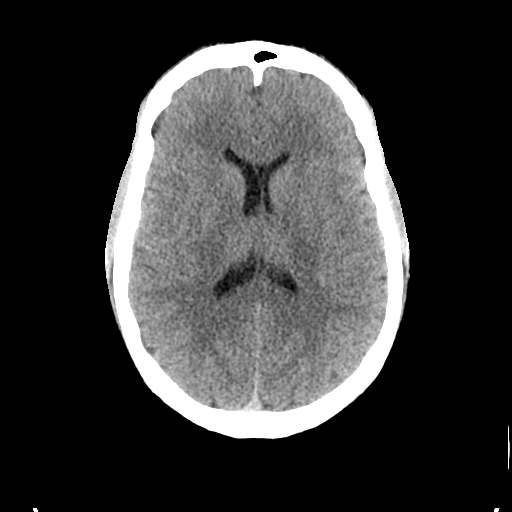
[im 22/34  brain]
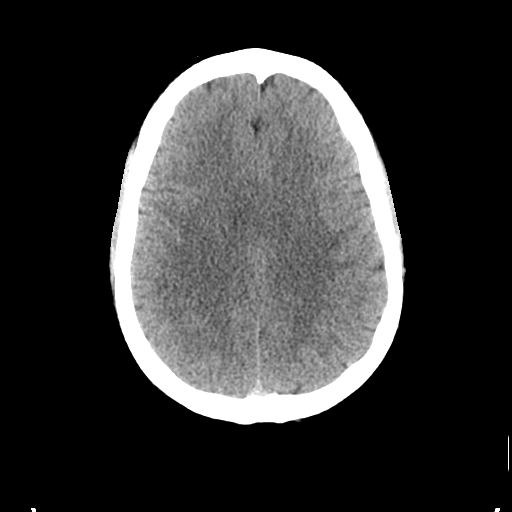
[im 25/34  brain]
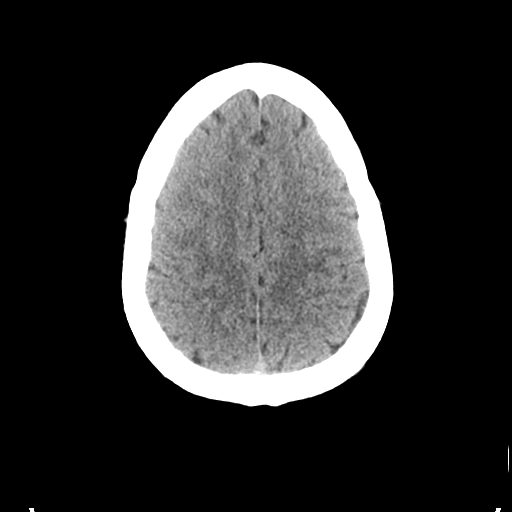
[im 28/34  brain]
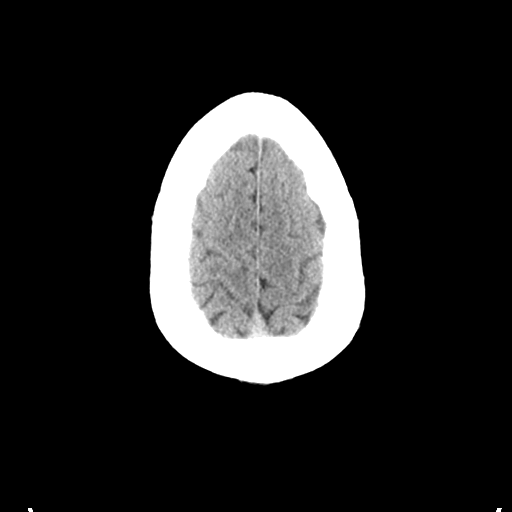
[im 28/34  bone]
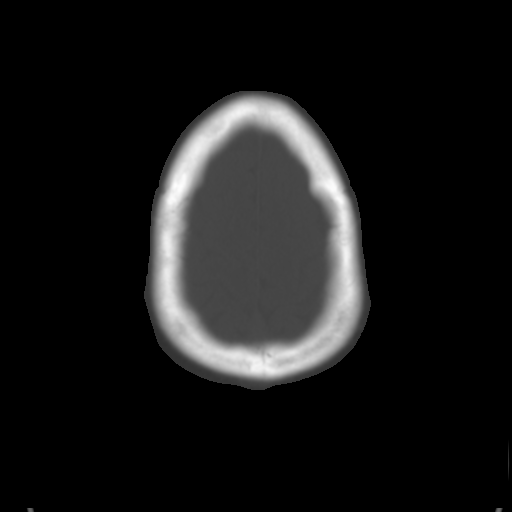
[im 31/34  brain]
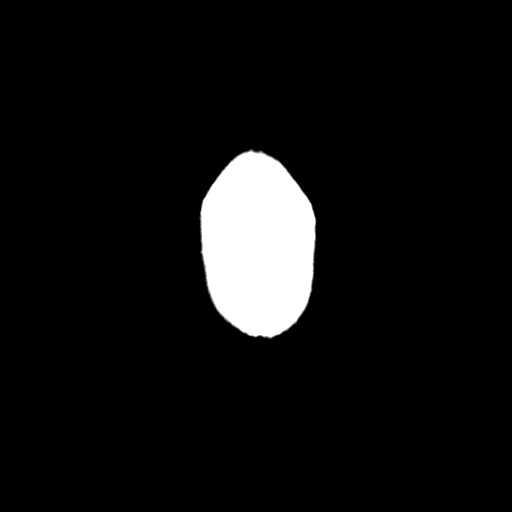

[Series 3: bone windows · axial · 0.46mm/px · z∈[-59,+49]mm · 7 of 51 slices shown]
[im 6/51  bone]
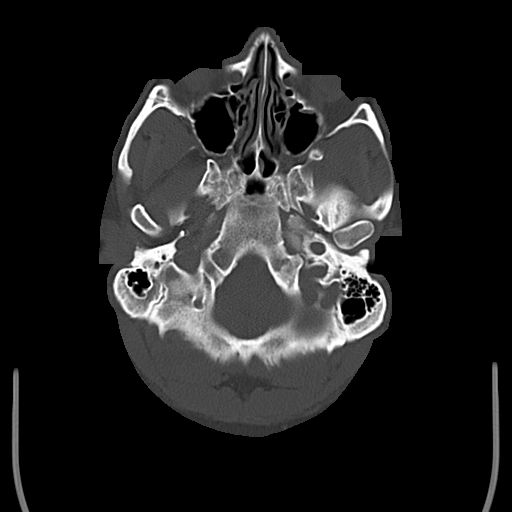
[im 12/51  bone]
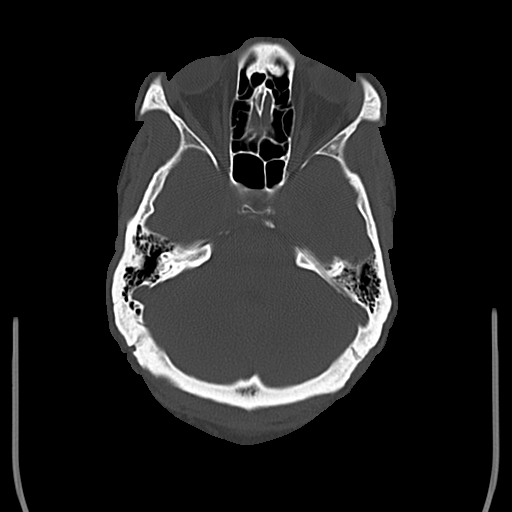
[im 18/51  bone]
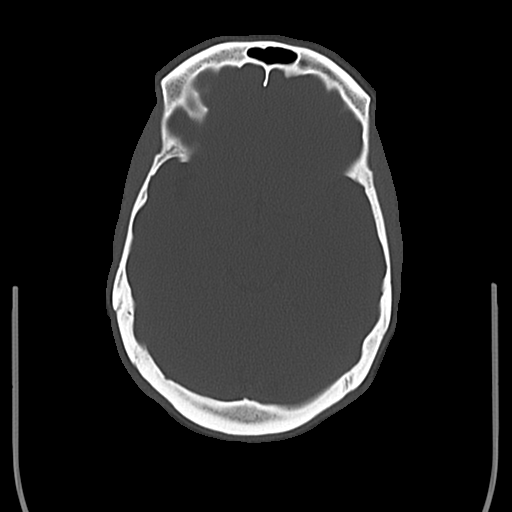
[im 24/51  bone]
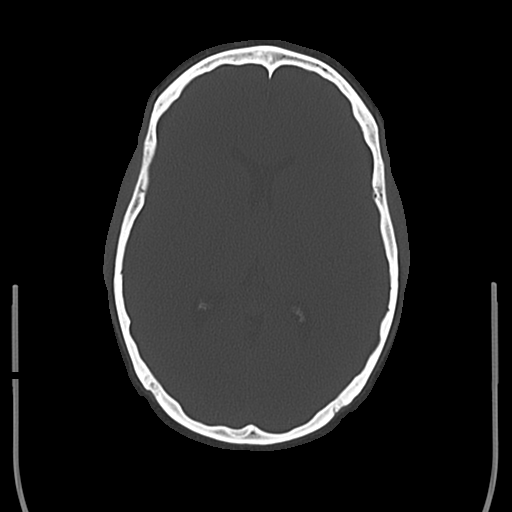
[im 30/51  bone]
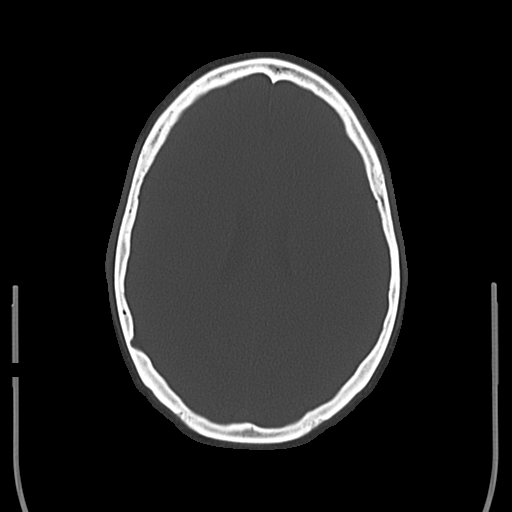
[im 36/51  bone]
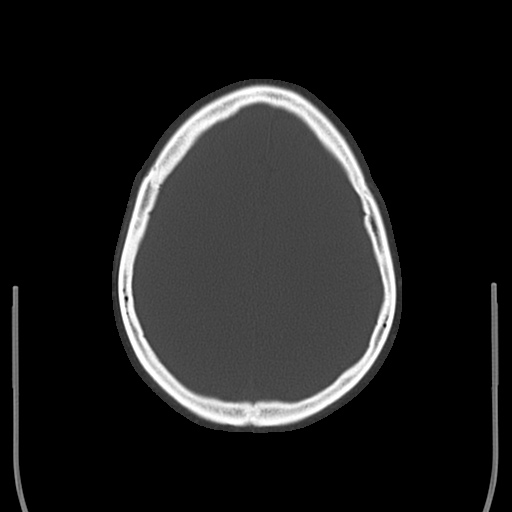
[im 42/51  bone]
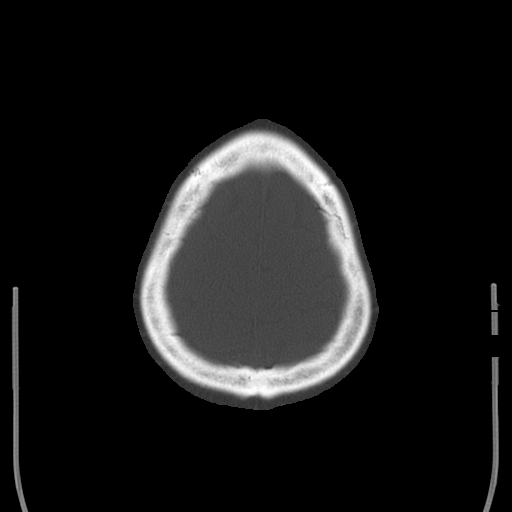

[17 of 30 positions shown; findings below may reference images not displayed]

FINDINGS: Negative for acute intracranial hemorrhage, acute infarction, mass,
mass effect, hydrocephalus or midline shift. Gray-white
differentiation is preserved throughout. No acute soft tissue or
calvarial abnormality. The globes and orbits are symmetric and
unremarkable. Normal aeration of the mastoid air cells and
visualized paranasal sinuses.
IMPRESSION: Negative head CT.

## 2016-07-04 ENCOUNTER — Encounter (HOSPITAL_COMMUNITY): Payer: Self-pay

## 2016-07-04 ENCOUNTER — Emergency Department (HOSPITAL_COMMUNITY)
Admission: EM | Admit: 2016-07-04 | Discharge: 2016-07-04 | Disposition: A | Discharge status: Home / Self Care | Payer: Medicaid Other | Attending: Emergency Medicine | Admitting: Emergency Medicine

## 2016-07-04 DIAGNOSIS — G43009 Migraine without aura, not intractable, without status migrainosus: Secondary | ICD-10-CM | POA: Insufficient documentation

## 2016-07-04 DIAGNOSIS — R51 Headache: Secondary | ICD-10-CM

## 2016-07-04 DIAGNOSIS — I10 Essential (primary) hypertension: Secondary | ICD-10-CM | POA: Insufficient documentation

## 2016-07-04 DIAGNOSIS — Z79899 Other long term (current) drug therapy: Secondary | ICD-10-CM | POA: Insufficient documentation

## 2016-07-04 DIAGNOSIS — R519 Headache, unspecified: Secondary | ICD-10-CM

## 2016-07-04 DIAGNOSIS — F17210 Nicotine dependence, cigarettes, uncomplicated: Secondary | ICD-10-CM | POA: Insufficient documentation

## 2016-07-04 MED ORDER — KETOROLAC TROMETHAMINE 15 MG/ML IJ SOLN
15.0000 mg | Freq: Once | INTRAMUSCULAR | Status: AC
  Administered 2016-07-04: 15 mg via INTRAVENOUS
  Filled 2016-07-04: qty 1

## 2016-07-04 MED ORDER — METOCLOPRAMIDE HCL 5 MG/ML IJ SOLN
10.0000 mg | Freq: Once | INTRAMUSCULAR | Status: AC
  Administered 2016-07-04: 10 mg via INTRAVENOUS
  Filled 2016-07-04: qty 2

## 2016-07-04 MED ORDER — IBUPROFEN 600 MG PO TABS: 600.0000 mg | ORAL_TABLET | Freq: Four times a day (QID) | ORAL | 0 refills | Status: DC | PRN

## 2016-07-04 MED ORDER — SODIUM CHLORIDE 0.9 % IV BOLUS (SEPSIS)
1000.0000 mL | Freq: Once | INTRAVENOUS | Status: AC
  Administered 2016-07-04: 1000 mL via INTRAVENOUS

## 2016-07-04 MED ORDER — DEXAMETHASONE SODIUM PHOSPHATE 10 MG/ML IJ SOLN
10.0000 mg | Freq: Once | INTRAMUSCULAR | Status: AC
  Administered 2016-07-04: 10 mg via INTRAVENOUS
  Filled 2016-07-04: qty 1

## 2016-07-04 NOTE — ED Provider Notes (Signed)
[facility]-EMERGENCY DEPT Provider Note   CSN: [id number] Arrival date & time: 07/04/16  0436     History   Chief Complaint Chief Complaint  Patient presents with  . Headache    HPI [name] is a 35 y.o. male.  HPI Pt comes in with cc of headaches. Pt reports that he has hx of chronic headaches that are similar. The current headache started x 3 days ago. The headaches are R sided, radiating to the back. The pain is throbbing and constant, and it is worse with light and noise. He has no nausea. PT also has blurry vision, currently just right side and intermittent dizziness - described as spinning sensation. All of these symptoms are typical with his headaches. Pt denies any current drug use but he does smoke 1 ppd. He reports that the headaches typically comes once every 2-3 weeks, but usually resolve with OTC meds. This time the headaches are not responding.  Past Medical History:  Diagnosis Date  . Anemia    baseline Hb- 12, microcytic  . Arthritis   . Gout   . Hypertension   . Polysubstance abuse    alcohol,cocaine, marijuana, tobacco  . Rheumatoid arteritis   . Tendonitis     Patient Active Problem List   Diagnosis Date Noted  . CPK, ABNORMAL 03/26/2010  . HIP PAIN, RIGHT 03/21/2010  . KNEE PAIN, RIGHT, ACUTE 02/02/2010  . GERD 04/20/2009  . HYPERTENSION NEC 01/11/2009    History reviewed. No pertinent surgical history.     Home Medications    Prior to Admission medications   Medication Sig Start Date End Date Taking? Authorizing Provider  naproxen sodium (ANAPROX) 220 MG tablet Take 440 mg by mouth every 12 (twelve) hours as needed (PAIN).   Yes Historical Provider, MD  nitroGLYCERIN (NITROSTAT) 0.4 MG SL tablet Place 0.4 mg under the tongue every 5 (five) minutes as needed for chest pain.   Yes Historical Provider, MD  ibuprofen (ADVIL,MOTRIN) 600 MG tablet Take 1 tablet (600 mg total) by mouth every 6 (six) hours as needed. 07/04/16   Derwood Kaplan, MD     Family History Family History  Problem Relation Age of Onset  . Hypertension Mother   . Hypertension Father     Social History Social History  Substance Use Topics  . Smoking status: Current Every Day Smoker    Packs/day: 1.00    Types: Cigarettes  . Smokeless tobacco: Never Used  . Alcohol use Yes     Comment: occasionally     Allergies   Amoxicillin; Ampicillin; Penicillins; and Robitussin (alcohol free) [guaifenesin]   Review of Systems Review of Systems  ROS 10 Systems reviewed and are negative for acute change except as noted in the HPI.     Physical Exam Updated Vital Signs BP 138/81 (BP Location: Left Arm)   Pulse 100   Temp 98.3 F (36.8 C) (Oral)   Resp 20   Ht 5\' 7"  (1.702 m)   Wt 220 lb (99.8 kg)   SpO2 100%   BMI 34.46 kg/m   Physical Exam  Constitutional: He is oriented to person, place, and time. He appears well-developed.  HENT:  Head: Normocephalic and atraumatic.  Eyes: Conjunctivae and EOM are normal. Pupils are equal, round, and reactive to light.  Neck: Normal range of motion. Neck supple.  Cardiovascular: Normal rate, regular rhythm and normal heart sounds.   Pulmonary/Chest: Effort normal and breath sounds normal. No respiratory distress. He has no wheezes.  Abdominal: Soft. Bowel sounds are normal. He exhibits no distension. There is no tenderness. There is no rebound and no guarding.  Neurological: He is alert and oriented to person, place, and time.  Bedside gross visual exam is fine, but pt does endorse blurry vision on the R side. CN 2-12 intact otherwise. Upper and lower extremity strength is 4+/5 Gross sensory exam for upper and lower extremity is normal. Cerebellar exam reveals no dymetria for finger to nose.  Skin: Skin is warm.     ED Treatments / Results  Labs (all labs ordered are listed, but only abnormal results are displayed) Labs Reviewed - No data to display  EKG  EKG Interpretation None        Radiology No results found.  Procedures Procedures (including critical care time)  Medications Ordered in ED Medications  sodium chloride 0.9 % bolus 1,000 mL (1,000 mLs Intravenous New Bag/Given 07/04/16 0648)  ketorolac (TORADOL) 15 MG/ML injection 15 mg (15 mg Intravenous Given 07/04/16 0649)  metoCLOPramide (REGLAN) injection 10 mg (10 mg Intravenous Given 07/04/16 0649)  dexamethasone (DECADRON) injection 10 mg (10 mg Intravenous Given 07/04/16 0649)     Initial Impression / Assessment and Plan / ED Course  I have reviewed the triage vital signs and the nursing notes.  Pertinent labs & imaging results that were available during my care of the patient were reviewed by me and considered in my medical decision making (see chart for details).  Clinical Course  Comment By Time  Upon reassessment, patient reports that the headache has improved significantly. His blurry vision has resolved. Strict return precautions discussed, pt will return to the ER if there is visual complains, seizures, altered mental status, loss of consciousness, dizziness, new focal weakness, or numbness.  Derwood Kaplan, MD 09/14 [phone]      Final Clinical Impressions(s) / ED Diagnoses   DDX includes: Primary headaches - including migrainous headaches, cluster headaches, tension headaches. ICH Verebral dissection Cavernous sinus thrombosis Vascular headaches AV malformation Brain aneurysm Muscular headaches  A/P: Pt comes in with cc of headaches. No concerns for life threatening secondary headaches because pt reports that these are his typical headaches that are just more severe. Headaches have characteristics of migraines - unilateral, throbbing, photo and phonophobia - and there is family hx of migraines. Neuro exam shows the blurry vision, otherwise unremarkable. We will start with headache cocktail. MRI from 2015, when he had come with similar complains was negative.    Final diagnoses:   Headache disorder  Migraine without aura and without status migrainosus, not intractable    New Prescriptions New Prescriptions   IBUPROFEN (ADVIL,MOTRIN) 600 MG TABLET    Take 1 tablet (600 mg total) by mouth every 6 (six) hours as needed.     Derwood Kaplan, MD 07/04/16 [phone]

## 2016-07-04 NOTE — ED Triage Notes (Signed)
Pt complains of a severe headache and blurred vision for three days

## 2016-07-04 NOTE — Discharge Instructions (Signed)
We saw you in the ER for headaches.  We are not sure what is causing your headaches, however, there appears to be no evidence of infection, bleeds or tumors based on our exam and results. We think the headaches could be migraine - so we recommend seeing a neurologist.  Please take motrin round the clock for the next 6 hours.  Please return to the ER if the headache gets severe and in not improving, you have associated new one sided numbness, tingling, weakness or confusion, seizures, poor balance or poor vision.

## 2016-12-19 ENCOUNTER — Emergency Department (HOSPITAL_COMMUNITY): Payer: Medicaid Other

## 2016-12-19 ENCOUNTER — Encounter (HOSPITAL_COMMUNITY): Payer: Self-pay

## 2016-12-19 ENCOUNTER — Emergency Department (HOSPITAL_COMMUNITY)
Admission: EM | Admit: 2016-12-19 | Discharge: 2016-12-19 | Disposition: A | Discharge status: Home / Self Care | Payer: Medicaid Other | Attending: Emergency Medicine | Admitting: Emergency Medicine

## 2016-12-19 DIAGNOSIS — Y939 Activity, unspecified: Secondary | ICD-10-CM | POA: Insufficient documentation

## 2016-12-19 DIAGNOSIS — S93402A Sprain of unspecified ligament of left ankle, initial encounter: Secondary | ICD-10-CM | POA: Insufficient documentation

## 2016-12-19 DIAGNOSIS — W01190A Fall on same level from slipping, tripping and stumbling with subsequent striking against furniture, initial encounter: Secondary | ICD-10-CM | POA: Insufficient documentation

## 2016-12-19 DIAGNOSIS — Y929 Unspecified place or not applicable: Secondary | ICD-10-CM | POA: Insufficient documentation

## 2016-12-19 DIAGNOSIS — I10 Essential (primary) hypertension: Secondary | ICD-10-CM | POA: Insufficient documentation

## 2016-12-19 DIAGNOSIS — F17210 Nicotine dependence, cigarettes, uncomplicated: Secondary | ICD-10-CM | POA: Insufficient documentation

## 2016-12-19 DIAGNOSIS — Y990 Civilian activity done for income or pay: Secondary | ICD-10-CM | POA: Insufficient documentation

## 2016-12-19 MED ORDER — CYCLOBENZAPRINE HCL 10 MG PO TABS
10.0000 mg | ORAL_TABLET | Freq: Once | ORAL | Status: AC
  Administered 2016-12-19: 10 mg via ORAL
  Filled 2016-12-19: qty 1

## 2016-12-19 MED ORDER — IBUPROFEN 800 MG PO TABS: 800.0000 mg | ORAL_TABLET | Freq: Three times a day (TID) | ORAL | 0 refills | Status: DC

## 2016-12-19 MED ORDER — IBUPROFEN 800 MG PO TABS
800.0000 mg | ORAL_TABLET | Freq: Once | ORAL | Status: AC
  Administered 2016-12-19: 800 mg via ORAL
  Filled 2016-12-19: qty 1

## 2016-12-19 MED ORDER — CYCLOBENZAPRINE HCL 10 MG PO TABS: 10.0000 mg | ORAL_TABLET | Freq: Two times a day (BID) | ORAL | 0 refills | Status: DC | PRN

## 2016-12-19 NOTE — ED Notes (Signed)
Pt declined crutched and ambulated out of the ED. He refused to sign out or get d/c vital signs.

## 2016-12-19 NOTE — ED Notes (Signed)
Pt vehemently declines urine drug screen.

## 2016-12-19 NOTE — ED Notes (Signed)
Pt hobbling out of room stating that he has a charlie horse. I explained that we do not want him to fall. He yelled that this is the only way he will feel better.

## 2016-12-19 NOTE — ED Provider Notes (Signed)
[facility]ER[facility] DEPT Provider Note   CSN: [id number] Arrival date & time: 12/19/16  1937  By signing my name below, I, Cynda Acres, attest that this documentation has been prepared under the direction and in the presence of Fayrene Helper.  Electronically Signed: Cynda Acres, Scribe. 12/19/16. 8:13 PM.  History   Chief Complaint Chief Complaint  Patient presents with  . Ankle Pain   HPI Comments: [name] is a 36 y.o. male with a hx of polysubstance abuse and gout, who presents to the Emergency Department complaining of sudden-onset, constant left ankle pain that began earlier today. Patient states he slipped on grease and his ankle went in the opposite direction. Patient continued to work, but is now in excruciating pain. Patient reports associated left foot pain, left calf pain, and left foot swelling. Patient describes the pain as sharp with a pain severity of 10/10. No modifying factors indicated. Patient is ambulatory, but reports limping. Patient reports a history of gout, no recent flair ups. Patient reports being allergic penicillin type medications. Patient denies any knee pain, fall, or any other symptoms.   The history is provided by the patient. No language interpreter was used.    Past Medical History:  Diagnosis Date  . Anemia    baseline Hb- 12, microcytic  . Arthritis   . Gout   . Hypertension   . Polysubstance abuse    alcohol,cocaine, marijuana, tobacco  . Rheumatoid arteritis   . Tendonitis     Patient Active Problem List   Diagnosis Date Noted  . CPK, ABNORMAL 03/26/2010  . HIP PAIN, RIGHT 03/21/2010  . KNEE PAIN, RIGHT, ACUTE 02/02/2010  . GERD 04/20/2009  . HYPERTENSION NEC 01/11/2009    No past surgical history on file.     Home Medications    Prior to Admission medications   Medication Sig Start Date End Date Taking? Authorizing Provider  ibuprofen (ADVIL,MOTRIN) 600 MG tablet Take 1 tablet (600 mg total) by mouth every 6 (six) hours as  needed. 07/04/16   Derwood Kaplan, MD  naproxen sodium (ANAPROX) 220 MG tablet Take 440 mg by mouth every 12 (twelve) hours as needed (PAIN).    Historical Provider, MD  nitroGLYCERIN (NITROSTAT) 0.4 MG SL tablet Place 0.4 mg under the tongue every 5 (five) minutes as needed for chest pain.    Historical Provider, MD    Family History Family History  Problem Relation Age of Onset  . Hypertension Mother   . Hypertension Father     Social History Social History  Substance Use Topics  . Smoking status: Current Every Day Smoker    Packs/day: 1.00    Types: Cigarettes  . Smokeless tobacco: Never Used  . Alcohol use Yes     Comment: occasionally     Allergies   Amoxicillin; Ampicillin; Penicillins; and Robitussin (alcohol free) [guaifenesin]   Review of Systems Review of Systems  Constitutional: Negative for chills and fever.  Gastrointestinal: Negative for diarrhea, nausea and vomiting.  Musculoskeletal: Positive for arthralgias (left foot/ankle) and joint swelling (left foot/ankle).     Physical Exam Updated Vital Signs BP (!) 153/105   Pulse 117   Temp 98.9 F (37.2 C) (Oral)   Resp 24   SpO2 100%   Physical Exam  Constitutional: He is oriented to person, place, and time. He appears well-developed.  Pt appears very uncomfortable. Screaming and appears to be in pain.   HENT:  Head: Normocephalic and atraumatic.  Mouth/Throat: Oropharynx is clear and moist.  Eyes: Conjunctivae and EOM are normal. Pupils are equal, round, and reactive to light.  Neck: Normal range of motion. Neck supple.  Cardiovascular: Normal rate.   Pulmonary/Chest: Effort normal.  Abdominal: Soft. Bowel sounds are normal.  Musculoskeletal: He exhibits tenderness.  Left lower extremity: left ankle exquisite tenderness noted to the lateral malleolar region with mild swelling noted, decreased dorsi flexion and plantar flexion secondary to pain. No gross deformity. DP pulse palpable. Tenderness to  the sole of foot specifically at the 5th metatarsal. Normal knee flexion and extensions. No TTP of the left knee. Lower leg compartment is soft.   Neurological: He is alert and oriented to person, place, and time.  neurovascularly intact.   Skin: Skin is warm and dry.     ED Treatments / Results  DIAGNOSTIC STUDIES: Oxygen Saturation is 100% on RA, normal by my interpretation.    COORDINATION OF CARE: 8:11 PM Discussed treatment plan with pt at bedside and pt agreed to plan, which includes a left foot/ankle x-ray and ibuprofen.   Labs (all labs ordered are listed, but only abnormal results are displayed) Labs Reviewed - No data to display  EKG  EKG Interpretation None       Radiology Dg Ankle Complete Left  Result Date: 12/19/2016 CLINICAL DATA:  Slip and fall with ankle and foot pain, initial encounter EXAM: LEFT ANKLE COMPLETE - 3+ VIEW COMPARISON:  05/18/2011 FINDINGS: There is no evidence of fracture, dislocation, or joint effusion. There is no evidence of arthropathy or other focal bone abnormality. Soft tissues are unremarkable. IMPRESSION: No acute abnormality noted. Electronically Signed   By: Alcide Clever M.D.   On: 12/19/2016 20:18   Dg Foot Complete Left  Result Date: 12/19/2016 CLINICAL DATA:  Trip and fall with left foot pain, initial encounter EXAM: LEFT FOOT - COMPLETE 3+ VIEW COMPARISON:  05/18/2011 FINDINGS: There is no evidence of fracture or dislocation. There is no evidence of arthropathy or other focal bone abnormality. Soft tissues are unremarkable. IMPRESSION: No acute abnormality noted. Electronically Signed   By: Alcide Clever M.D.   On: 12/19/2016 20:21    Procedures Procedures (including critical care time)  Medications Ordered in ED Medications  ibuprofen (ADVIL,MOTRIN) tablet 800 mg (800 mg Oral Given 12/19/16 2017)  cyclobenzaprine (FLEXERIL) tablet 10 mg (10 mg Oral Given 12/19/16 2017)     Initial Impression / Assessment and Plan / ED Course  I  have reviewed the triage vital signs and the nursing notes.  Pertinent labs & imaging results that were available during my care of the patient were reviewed by me and considered in my medical decision making (see chart for details).     BP (!) 153/105   Pulse 117   Temp 98.9 F (37.2 C) (Oral)   Resp 24   SpO2 100%  Tachycardia, likely from pain.   Final Clinical Impressions(s) / ED Diagnoses   Final diagnoses:  Sprain of left ankle, unspecified ligament, initial encounter    New Prescriptions New Prescriptions   No medications on file   I personally performed the services described in this documentation, which was scribed in my presence. The recorded information has been reviewed and is accurate.     8:26 PM Pt with hx of polysubstance abuse presenting with L ankle injury while at work.  Report mechanical injury when slipping on wet floor.  Xray of L ankle and L foot without acute changes.  ASO applied for provide for comfort.  RICE therapy discussed.  Ortho referral given as needed.  Pt is NVI.     Fayrene Helper, PA-C 12/19/16 [id number]    Gwyneth Sprout, MD 12/20/16 [phone]

## 2016-12-19 NOTE — ED Triage Notes (Addendum)
Pt was working and slipped and hit his L ankle on the bottom of a table. He denies falling or hitting head. He reports that it hurts to ambulate. Employer is requesting a worker's compensation drug test. A&Ox4.

## 2016-12-27 ENCOUNTER — Encounter (HOSPITAL_COMMUNITY): Payer: Self-pay | Admitting: Emergency Medicine

## 2016-12-27 ENCOUNTER — Ambulatory Visit (HOSPITAL_COMMUNITY)
Admission: EM | Admit: 2016-12-27 | Discharge: 2016-12-27 | Disposition: A | Discharge status: Home / Self Care | Payer: Medicaid Other | Attending: Emergency Medicine | Admitting: Emergency Medicine

## 2016-12-27 DIAGNOSIS — R519 Headache, unspecified: Secondary | ICD-10-CM

## 2016-12-27 DIAGNOSIS — R51 Headache: Secondary | ICD-10-CM

## 2016-12-27 MED ORDER — DEXAMETHASONE SODIUM PHOSPHATE 10 MG/ML IJ SOLN
INTRAMUSCULAR | Status: AC
  Filled 2016-12-27: qty 1

## 2016-12-27 MED ORDER — KETOROLAC TROMETHAMINE 30 MG/ML IJ SOLN
INTRAMUSCULAR | Status: AC
  Filled 2016-12-27: qty 2

## 2016-12-27 MED ORDER — KETOROLAC TROMETHAMINE 60 MG/2ML IM SOLN
45.0000 mg | Freq: Once | INTRAMUSCULAR | Status: AC
  Administered 2016-12-27: 45 mg via INTRAMUSCULAR

## 2016-12-27 MED ORDER — METOCLOPRAMIDE HCL 5 MG/ML IJ SOLN
INTRAMUSCULAR | Status: AC
  Filled 2016-12-27: qty 2

## 2016-12-27 MED ORDER — DEXAMETHASONE SODIUM PHOSPHATE 10 MG/ML IJ SOLN
10.0000 mg | Freq: Once | INTRAMUSCULAR | Status: AC
  Administered 2016-12-27: 10 mg via INTRAMUSCULAR

## 2016-12-27 MED ORDER — METOCLOPRAMIDE HCL 5 MG/ML IJ SOLN
5.0000 mg | Freq: Once | INTRAMUSCULAR | Status: AC
  Administered 2016-12-27: 5 mg via INTRAMUSCULAR

## 2016-12-27 NOTE — ED Provider Notes (Signed)
CSN: [id number]     Arrival date & time 12/27/16  1547 History   First MD Initiated Contact with Patient 12/27/16 1626     Chief Complaint  Patient presents with  . Headache  . Blurred Vision   (Consider location/radiation/quality/duration/timing/severity/associated sxs/prior Treatment) 36 year old male presents to the urgent care with a "migraine headache". He has a history of similar type headaches that he calls migraine for some years. He states he has about 2 these headaches a week. He does not have a PCP. He has been to the emergency department for headaches. The last visit to the ED and 2017 was quite similar clinically as today. He is complaining of headache pain to the right side of the head primarily in the temporal parietal area and the right posterior neck musculature. Both these areas have tenderness. Complaining of blurriness of vision in the right eye. He denies photophobia except when the lights are very bright, denies focal paresthesias or weakness, denies nausea or vomiting. He states this headache is typical of previous headaches. He has taken no medication.      Past Medical History:  Diagnosis Date  . Anemia    baseline Hb- 12, microcytic  . Arthritis   . Gout   . Hypertension   . Polysubstance abuse    alcohol,cocaine, marijuana, tobacco  . Rheumatoid arteritis   . Tendonitis    History reviewed. No pertinent surgical history. Family History  Problem Relation Age of Onset  . Hypertension Mother   . Hypertension Father    Social History  Substance Use Topics  . Smoking status: Current Every Day Smoker    Packs/day: 1.00    Types: Cigarettes  . Smokeless tobacco: Never Used  . Alcohol use Yes     Comment: occasionally    Review of Systems  Constitutional: Positive for activity change. Negative for fever.  HENT: Negative.   Eyes: Positive for photophobia and visual disturbance.  Respiratory: Negative.   Cardiovascular: Negative.   Gastrointestinal:  Negative.   Musculoskeletal: Negative.   Skin: Negative.   Neurological: Positive for headaches. Negative for syncope and facial asymmetry.       He mumbles when he speaks. He is groggy and was asleep when I first entered the room.  All other systems reviewed and are negative.   Allergies  Amoxicillin; Ampicillin; Penicillins; and Robitussin (alcohol free) [guaifenesin]  Home Medications   Prior to Admission medications   Medication Sig Start Date End Date Taking? Authorizing Provider  cyclobenzaprine (FLEXERIL) 10 MG tablet Take 1 tablet (10 mg total) by mouth 2 (two) times daily as needed for muscle spasms. 12/19/16   Fayrene Helper, PA-C  ibuprofen (ADVIL,MOTRIN) 800 MG tablet Take 1 tablet (800 mg total) by mouth 3 (three) times daily. 12/19/16   Fayrene Helper, PA-C  naproxen sodium (ANAPROX) 220 MG tablet Take 440 mg by mouth every 12 (twelve) hours as needed (PAIN).    Historical Provider, MD  nitroGLYCERIN (NITROSTAT) 0.4 MG SL tablet Place 0.4 mg under the tongue every 5 (five) minutes as needed for chest pain.    Historical Provider, MD   Meds Ordered and Administered this Visit   Medications  ketorolac (TORADOL) injection 45 mg (45 mg Intramuscular Given 12/27/16 1658)  metoCLOPramide (REGLAN) injection 5 mg (5 mg Intramuscular Given 12/27/16 1656)  dexamethasone (DECADRON) injection 10 mg (10 mg Intramuscular Given 12/27/16 1654)    BP (!) 140/101 (BP Location: Left Arm)   Pulse 83   Temp 99.2 F (37.3  C) (Oral)   Resp 20   SpO2 100%  No data found.   Physical Exam  Constitutional: He is oriented to person, place, and time. He appears well-developed and well-nourished. No distress.  Speech is slightly dysarthric more of a mumble. No facial weakness.  HENT:  Head: Normocephalic and atraumatic.  Nose: Nose normal.  Mouth/Throat: Oropharynx is clear and moist.  Eyes: Conjunctivae and EOM are normal.  Pupils are pinpoint.  Neck: Normal range of motion. Neck supple.   Cardiovascular: Normal rate, regular rhythm, normal heart sounds and intact distal pulses.   Pulmonary/Chest: Effort normal and breath sounds normal. No respiratory distress.  Musculoskeletal: Normal range of motion. He exhibits no edema.  Lymphadenopathy:    He has no cervical adenopathy.  Neurological: He is alert and oriented to person, place, and time. He has normal strength. No cranial nerve deficit. He exhibits normal muscle tone. Coordination normal. GCS eye subscore is 4. GCS verbal subscore is 5. GCS motor subscore is 6.  Finger to nose is normal. Upper and lower extremity strength is 5 over 5.  Skin: Skin is warm and dry. He is not diaphoretic.  Psychiatric: Judgment normal.  Nursing note and vitals reviewed.   Urgent Care Course     Procedures (including critical care time)  Labs Review Labs Reviewed - No data to display  Imaging Review No results found.   Visual Acuity Review  Right Eye Distance:   Left Eye Distance:   Bilateral Distance:    Right Eye Near:   Left Eye Near:    Bilateral Near:         MDM   1. Headache disorder    Home rest. Need to obtain a primary care provider to care for your headaches. He may need referral to a neurologist which were having so many headaches. For any worsening, new symptoms or problems go to the emergency department. Meds ordered this encounter  Medications  . ketorolac (TORADOL) injection 45 mg  . metoCLOPramide (REGLAN) injection 5 mg  . dexamethasone (DECADRON) injection 10 mg       Hayden Rasmussen, NP 12/27/16 1714

## 2016-12-27 NOTE — ED Triage Notes (Signed)
Pt here for HA onset this am associated w/blurred vision  Reports he woke up this am w/the pain  BP now is 140/101... Smokes 1 PPD... Smoked 10 min ago before being triaged  Denies: CP, diaphoresis, n/v, weakness  Also reports he smokes marijuana and drinks alcohol... Had both yest.   Significant other at bedside... Adv partner to notify a clinical staff of any new/worsenging sx... Notified Teena Irani, NP

## 2016-12-27 NOTE — Discharge Instructions (Signed)
Home rest. Need to obtain a primary care provider to care for your headaches. He may need referral to a neurologist which were having so many headaches. For any worsening, new symptoms or problems go to the emergency department.

## 2017-01-01 ENCOUNTER — Emergency Department (HOSPITAL_COMMUNITY)
Admission: EM | Admit: 2017-01-01 | Discharge: 2017-01-02 | Disposition: A | Discharge status: Home / Self Care | Payer: Medicaid Other | Attending: Emergency Medicine | Admitting: Emergency Medicine

## 2017-01-01 ENCOUNTER — Encounter (HOSPITAL_COMMUNITY): Payer: Self-pay | Admitting: Family Medicine

## 2017-01-01 DIAGNOSIS — Z79899 Other long term (current) drug therapy: Secondary | ICD-10-CM | POA: Insufficient documentation

## 2017-01-01 DIAGNOSIS — F17210 Nicotine dependence, cigarettes, uncomplicated: Secondary | ICD-10-CM | POA: Insufficient documentation

## 2017-01-01 DIAGNOSIS — G43009 Migraine without aura, not intractable, without status migrainosus: Secondary | ICD-10-CM | POA: Insufficient documentation

## 2017-01-01 DIAGNOSIS — I10 Essential (primary) hypertension: Secondary | ICD-10-CM | POA: Insufficient documentation

## 2017-01-01 NOTE — ED Triage Notes (Signed)
Patient is complaining of right sided headache pain that started 2-3 days ago. Denies nausea, vomiting, fever, or photo sensitivity. Pt denies taking medication for his symptoms.

## 2017-01-02 MED ORDER — METOCLOPRAMIDE HCL 10 MG PO TABS
10.0000 mg | ORAL_TABLET | Freq: Once | ORAL | Status: AC
  Administered 2017-01-02: 10 mg via ORAL
  Filled 2017-01-02: qty 1

## 2017-01-02 MED ORDER — IBUPROFEN 800 MG PO TABS
800.0000 mg | ORAL_TABLET | Freq: Once | ORAL | Status: AC
  Administered 2017-01-02: 800 mg via ORAL
  Filled 2017-01-02: qty 1

## 2017-01-02 MED ORDER — DEXAMETHASONE 4 MG PO TABS
10.0000 mg | ORAL_TABLET | Freq: Once | ORAL | Status: AC
  Administered 2017-01-02: 10 mg via ORAL
  Filled 2017-01-02: qty 2

## 2017-01-02 MED ORDER — DIPHENHYDRAMINE HCL 25 MG PO CAPS
50.0000 mg | ORAL_CAPSULE | Freq: Once | ORAL | Status: AC
  Administered 2017-01-02: 50 mg via ORAL
  Filled 2017-01-02: qty 2

## 2017-01-02 NOTE — ED Notes (Signed)
Pt ambulatory and independent at discharge.  Verbalized understanding of discharge instructions 

## 2017-01-02 NOTE — ED Provider Notes (Signed)
[facility]-EMERGENCY DEPT Provider Note   CSN: [id number] Arrival date & time: 01/01/17  2328     History   Chief Complaint Chief Complaint  Patient presents with  . Headache    HPI KAILASH HINZE is a 36 y.o. male.  The history is provided by the patient.  Headache   This is a recurrent problem. The current episode started 12 to 24 hours ago. The problem occurs constantly. The problem has not changed since onset.The headache is associated with nothing. The pain is located in the right unilateral region. The quality of the pain is described as dull. The pain is moderate. The pain does not radiate. Pertinent negatives include no anorexia, no fever, no syncope, no nausea and no vomiting. He has tried nothing for the symptoms.    Past Medical History:  Diagnosis Date  . Anemia    baseline Hb- 12, microcytic  . Arthritis   . Gout   . Hypertension   . Polysubstance abuse    alcohol,cocaine, marijuana, tobacco  . Rheumatoid arteritis   . Tendonitis     Patient Active Problem List   Diagnosis Date Noted  . CPK, ABNORMAL 03/26/2010  . HIP PAIN, RIGHT 03/21/2010  . KNEE PAIN, RIGHT, ACUTE 02/02/2010  . GERD 04/20/2009  . HYPERTENSION NEC 01/11/2009    History reviewed. No pertinent surgical history.     Home Medications    Prior to Admission medications   Medication Sig Start Date End Date Taking? Authorizing Provider  cyclobenzaprine (FLEXERIL) 10 MG tablet Take 1 tablet (10 mg total) by mouth 2 (two) times daily as needed for muscle spasms. 12/19/16   Fayrene Helper, PA-C  ibuprofen (ADVIL,MOTRIN) 800 MG tablet Take 1 tablet (800 mg total) by mouth 3 (three) times daily. 12/19/16   Fayrene Helper, PA-C  naproxen sodium (ANAPROX) 220 MG tablet Take 440 mg by mouth every 12 (twelve) hours as needed (PAIN).    Historical Provider, MD  nitroGLYCERIN (NITROSTAT) 0.4 MG SL tablet Place 0.4 mg under the tongue every 5 (five) minutes as needed for chest pain.    Historical Provider, MD     Family History Family History  Problem Relation Age of Onset  . Hypertension Mother   . Hypertension Father     Social History Social History  Substance Use Topics  . Smoking status: Current Every Day Smoker    Packs/day: 1.00    Types: Cigarettes  . Smokeless tobacco: Never Used  . Alcohol use Yes     Comment: occasionally     Allergies   Amoxicillin; Ampicillin; Penicillins; and Robitussin (alcohol free) [guaifenesin]   Review of Systems Review of Systems  Constitutional: Negative for fever.  Cardiovascular: Negative for syncope.  Gastrointestinal: Negative for anorexia, nausea and vomiting.  Neurological: Positive for headaches.  All other systems reviewed and are negative.    Physical Exam Updated Vital Signs BP 139/97 (BP Location: Left Arm)   Pulse 105   Temp 98.3 F (36.8 C) (Oral)   Resp 20   Ht 5\' 7"  (1.702 m)   Wt 210 lb (95.3 kg)   SpO2 100%   BMI 32.89 kg/m   Physical Exam  Constitutional: He is oriented to person, place, and time. He appears well-developed and well-nourished. No distress.  HENT:  Head: Normocephalic and atraumatic.  Nose: Nose normal.  Eyes: Conjunctivae are normal.  Neck: Neck supple. No tracheal deviation present.  Cardiovascular: Normal rate and regular rhythm.   Pulmonary/Chest: Effort normal. No respiratory distress.  Abdominal: Soft. He exhibits no distension.  Neurological: He is alert and oriented to person, place, and time. No cranial nerve deficit or sensory deficit. Coordination normal.  Normal finger to nose testing and rapid alternating movement   Skin: Skin is warm and dry.  Psychiatric: He has a normal mood and affect.     ED Treatments / Results  Labs (all labs ordered are listed, but only abnormal results are displayed) Labs Reviewed - No data to display  EKG  EKG Interpretation None       Radiology No results found.  Procedures Procedures (including critical care time)  Medications  Ordered in ED Medications  metoCLOPramide (REGLAN) tablet 10 mg (10 mg Oral Given 01/02/17 0028)  ibuprofen (ADVIL,MOTRIN) tablet 800 mg (800 mg Oral Given 01/02/17 0023)  dexamethasone (DECADRON) tablet 10 mg (10 mg Oral Given 01/02/17 0023)  diphenhydrAMINE (BENADRYL) capsule 50 mg (50 mg Oral Given 01/02/17 0023)     Initial Impression / Assessment and Plan / ED Course  I have reviewed the triage vital signs and the nursing notes.  Pertinent labs & imaging results that were available during my care of the patient were reviewed by me and considered in my medical decision making (see chart for details).     36 y.o. male presents with recurrent right sided headache described as migraine. Has been noncomliant with NSAIDs since last visit. Discussed need for compliance. Prefers oral medications over injections so those were provided for supportive care and stressed picking up Rx for motrin or picking up a bottle over the counter. Well appearing without signs of ICH or any deficits.   Final Clinical Impressions(s) / ED Diagnoses   Final diagnoses:  Migraine without aura and without status migrainosus, not intractable    New Prescriptions Discharge Medication List as of 01/02/2017 12:11 AM       Lyndal Pulley, MD 01/02/17 [phone]

## 2017-02-01 ENCOUNTER — Encounter (HOSPITAL_COMMUNITY): Payer: Self-pay | Admitting: *Deleted

## 2017-02-01 ENCOUNTER — Ambulatory Visit (INDEPENDENT_AMBULATORY_CARE_PROVIDER_SITE_OTHER): Payer: Self-pay

## 2017-02-01 ENCOUNTER — Ambulatory Visit (HOSPITAL_COMMUNITY)
Admission: EM | Admit: 2017-02-01 | Discharge: 2017-02-01 | Disposition: A | Discharge status: Home / Self Care | Payer: Medicaid Other | Attending: Radiology | Admitting: Radiology

## 2017-02-01 DIAGNOSIS — M25512 Pain in left shoulder: Secondary | ICD-10-CM

## 2017-02-01 MED ORDER — KETOROLAC TROMETHAMINE 30 MG/ML IJ SOLN
INTRAMUSCULAR | Status: AC
  Filled 2017-02-01: qty 1

## 2017-02-01 MED ORDER — KETOROLAC TROMETHAMINE 30 MG/ML IJ SOLN
30.0000 mg | Freq: Once | INTRAMUSCULAR | Status: AC
  Administered 2017-02-01: 30 mg via INTRAMUSCULAR

## 2017-02-01 MED ORDER — CYCLOBENZAPRINE HCL 10 MG PO TABS: 10.0000 mg | ORAL_TABLET | Freq: Two times a day (BID) | ORAL | 0 refills | Status: DC | PRN

## 2017-02-01 NOTE — ED Provider Notes (Signed)
CSN: [id number]     Arrival date & time 02/01/17  1802 History   First MD Initiated Contact with Patient 02/01/17 1902     Chief Complaint  Patient presents with  . Shoulder Pain   (Consider location/radiation/quality/duration/timing/severity/associated sxs/prior Treatment) 36 y.o. male presents with persistent and worsening left sided shoulder pain that radiates down arm and up the left side of neck X 2 days. Patient describes it "as something came out in my shoulder" Patient denies any trauma and states that he woke up yesterday hurting. Skin is warm and dry, radial pulse intact. Patient denies any chest pain. Condition is acute in nature. Condition is made better by nothing. Condition is made worse by nothing. Patient denies any relief from OTC pain medication  prior to there arrival at this facility. Patient denies any chest pain nausea vomiting or diaphoresis       Past Medical History:  Diagnosis Date  . Anemia    baseline Hb- 12, microcytic  . Arthritis   . Gout   . Hypertension   . Polysubstance abuse    alcohol,cocaine, marijuana, tobacco  . Rheumatoid arteritis   . Tendonitis    History reviewed. No pertinent surgical history. Family History  Problem Relation Age of Onset  . Hypertension Mother   . Hypertension Father    Social History  Substance Use Topics  . Smoking status: Current Every Day Smoker    Packs/day: 1.00    Types: Cigarettes  . Smokeless tobacco: Never Used  . Alcohol use Yes     Comment: occasionally    Review of Systems  Constitutional: Negative for chills and fever.  HENT: Negative for ear pain and sore throat.   Eyes: Negative for pain and visual disturbance.  Respiratory: Negative for cough and shortness of breath.   Cardiovascular: Negative for chest pain and palpitations.  Gastrointestinal: Negative for abdominal pain and vomiting.  Genitourinary: Negative for dysuria and hematuria.  Musculoskeletal: Positive for arthralgias ( pain to  left should radiating down arm and up nexk ). Negative for back pain.  Skin: Negative for color change and rash.  Neurological: Negative for seizures and syncope.  All other systems reviewed and are negative.   Allergies  Amoxicillin; Ampicillin; Penicillins; and Robitussin (alcohol free) [guaifenesin]  Home Medications   Prior to Admission medications   Medication Sig Start Date End Date Taking? Authorizing Provider  cyclobenzaprine (FLEXERIL) 10 MG tablet Take 1 tablet (10 mg total) by mouth 2 (two) times daily as needed for muscle spasms. 02/01/17   Alene Mires, NP  ibuprofen (ADVIL,MOTRIN) 800 MG tablet Take 1 tablet (800 mg total) by mouth 3 (three) times daily. 12/19/16   Fayrene Helper, PA-C  naproxen sodium (ANAPROX) 220 MG tablet Take 440 mg by mouth every 12 (twelve) hours as needed (PAIN).    Historical Provider, MD  nitroGLYCERIN (NITROSTAT) 0.4 MG SL tablet Place 0.4 mg under the tongue every 5 (five) minutes as needed for chest pain.    Historical Provider, MD   Meds Ordered and Administered this Visit   Medications  ketorolac (TORADOL) 30 MG/ML injection 30 mg (30 mg Intramuscular Given 02/01/17 1915)    BP 124/87   Pulse (!) 102   Temp 98.6 F (37 C) (Oral)   Resp 18   SpO2 97%  No data found.   Physical Exam  Constitutional: He appears well-developed and well-nourished.  HENT:  Head: Normocephalic and atraumatic.  Eyes: Conjunctivae are normal.  Neck: Neck supple.  Cardiovascular: Normal rate and regular rhythm.   No murmur heard. Pulmonary/Chest: Effort normal and breath sounds normal. No respiratory distress.  Abdominal: Soft. There is no tenderness.  Musculoskeletal: He exhibits no edema.  Neurological: He is alert.  Skin: Skin is warm and dry.  Psychiatric: He has a normal mood and affect.  Nursing note and vitals reviewed.   Urgent Care Course     Procedures (including critical care time)  Labs Review Labs Reviewed - No data to  display  Imaging Review Dg Shoulder Left  Result Date: 02/01/2017 CLINICAL DATA:  Limited range of motion x2 days with pain. History of remote fracture of the left clavicle as a child. EXAM: LEFT SHOULDER - 2+ VIEW COMPARISON:  None. FINDINGS: No acute displaced fracture. No malalignment of the acromioclavicular nor glenohumeral joints. Healed fracture deformity of the distal clavicle and acromion. The adjacent ribs and lung are nonacute. No abnormal soft tissue mass or mineralization. IMPRESSION: Negative for acute fracture dislocation of the left shoulder. Rotator cuff, soft tissue and labral pathology may be better assessed with MRI on a nonemergent basis if clinically warranted. Electronically Signed   By: Tollie Eth M.D.   On: 02/01/2017 19:49     Patient states that his pain has not changed with toradol injection   MDM   1. Acute pain of left shoulder      Alene Mires, NP 02/01/17 2006

## 2017-02-01 NOTE — Discharge Instructions (Signed)
Follow  up with emergency department if any cardiac symptoms occur. Can take ibuprofen for pain and inflammation up to 800 mg every 8 hours

## 2017-02-01 NOTE — ED Triage Notes (Signed)
States woke up yesterday with left shoulder feeling dislocated.  c/O numbness and tingling in LUE.  LUE pulses strong.  All LUE fingers warm with prompt cap refill.

## 2018-04-16 ENCOUNTER — Other Ambulatory Visit: Payer: Self-pay

## 2018-04-16 ENCOUNTER — Emergency Department (HOSPITAL_COMMUNITY)
Admission: EM | Admit: 2018-04-16 | Discharge: 2018-04-17 | Disposition: A | Discharge status: Home / Self Care | Payer: Self-pay | Attending: Emergency Medicine | Admitting: Emergency Medicine

## 2018-04-16 ENCOUNTER — Encounter (HOSPITAL_COMMUNITY): Payer: Self-pay | Admitting: *Deleted

## 2018-04-16 DIAGNOSIS — W500XXA Accidental hit or strike by another person, initial encounter: Secondary | ICD-10-CM | POA: Insufficient documentation

## 2018-04-16 DIAGNOSIS — Y939 Activity, unspecified: Secondary | ICD-10-CM | POA: Insufficient documentation

## 2018-04-16 DIAGNOSIS — I10 Essential (primary) hypertension: Secondary | ICD-10-CM | POA: Insufficient documentation

## 2018-04-16 DIAGNOSIS — Z79899 Other long term (current) drug therapy: Secondary | ICD-10-CM | POA: Insufficient documentation

## 2018-04-16 DIAGNOSIS — Y999 Unspecified external cause status: Secondary | ICD-10-CM | POA: Insufficient documentation

## 2018-04-16 DIAGNOSIS — S20211A Contusion of right front wall of thorax, initial encounter: Secondary | ICD-10-CM | POA: Insufficient documentation

## 2018-04-16 DIAGNOSIS — Y929 Unspecified place or not applicable: Secondary | ICD-10-CM | POA: Insufficient documentation

## 2018-04-16 DIAGNOSIS — F17210 Nicotine dependence, cigarettes, uncomplicated: Secondary | ICD-10-CM | POA: Insufficient documentation
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
## 2018-04-17 ENCOUNTER — Emergency Department (HOSPITAL_COMMUNITY): Payer: Self-pay

## 2018-04-17 MED ORDER — TRAMADOL HCL 50 MG PO TABS: 50.0000 mg | ORAL_TABLET | Freq: Four times a day (QID) | ORAL | 0 refills | Status: DC | PRN

## 2018-04-17 MED ORDER — OXYCODONE-ACETAMINOPHEN 5-325 MG PO TABS
2.0000 | ORAL_TABLET | Freq: Once | ORAL | Status: AC
  Administered 2018-04-17: 2 via ORAL
  Filled 2018-04-17: qty 2

## 2018-04-17 MED ORDER — LIDOCAINE 5 % EX PTCH
1.0000 | MEDICATED_PATCH | CUTANEOUS | Status: DC
  Administered 2018-04-17: 1 via TRANSDERMAL
  Filled 2018-04-17: qty 1

## 2018-04-17 MED ORDER — NAPROXEN 500 MG PO TABS: 500.0000 mg | ORAL_TABLET | Freq: Two times a day (BID) | ORAL | 0 refills | Status: DC

## 2018-04-17 NOTE — Discharge Instructions (Signed)
Alternate ice to areas of injury 3-4 times per day to limit inflammation.  Avoid strenuous activity and heavy lifting.  We recommend consistent use of naproxen for management of inflammation and pain.  You have been prescribed tramadol to take as needed for severe pain.  Do not drive or drink alcohol after taking this medication as it may make you drowsy and impair your judgment.  Use an incentive spirometer at least once per hour while awake to ensure that you are taking deep breaths.  We recommend follow-up with a primary care doctor to ensure resolution of symptoms.  Return to the ED for any new or concerning symptoms.
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
## 2018-04-17 NOTE — ED Notes (Signed)
PT states understanding of care given, follow up care, and medication prescribed. PT ambulated from ED to car with a steady gait. 

## 2018-10-31 ENCOUNTER — Emergency Department (HOSPITAL_COMMUNITY)
Admission: EM | Admit: 2018-10-31 | Discharge: 2018-10-31 | Disposition: A | Discharge status: Home / Self Care | Payer: Self-pay | Attending: Emergency Medicine | Admitting: Emergency Medicine

## 2018-10-31 ENCOUNTER — Encounter (HOSPITAL_COMMUNITY): Payer: Self-pay

## 2018-10-31 DIAGNOSIS — I10 Essential (primary) hypertension: Secondary | ICD-10-CM | POA: Insufficient documentation

## 2018-10-31 DIAGNOSIS — Y999 Unspecified external cause status: Secondary | ICD-10-CM | POA: Insufficient documentation

## 2018-10-31 DIAGNOSIS — Y9389 Activity, other specified: Secondary | ICD-10-CM | POA: Insufficient documentation

## 2018-10-31 DIAGNOSIS — F17210 Nicotine dependence, cigarettes, uncomplicated: Secondary | ICD-10-CM | POA: Insufficient documentation

## 2018-10-31 DIAGNOSIS — Y929 Unspecified place or not applicable: Secondary | ICD-10-CM | POA: Insufficient documentation

## 2018-10-31 DIAGNOSIS — S61012A Laceration without foreign body of left thumb without damage to nail, initial encounter: Secondary | ICD-10-CM | POA: Insufficient documentation

## 2018-10-31 DIAGNOSIS — W260XXA Contact with knife, initial encounter: Secondary | ICD-10-CM | POA: Insufficient documentation

## 2018-10-31 DIAGNOSIS — Z79899 Other long term (current) drug therapy: Secondary | ICD-10-CM | POA: Insufficient documentation

## 2018-10-31 MED ORDER — NAPROXEN 250 MG PO TABS
500.0000 mg | ORAL_TABLET | Freq: Once | ORAL | Status: AC
  Administered 2018-10-31: 500 mg via ORAL
  Filled 2018-10-31: qty 2

## 2018-10-31 NOTE — ED Provider Notes (Signed)
[facility] Provider Note   CSN: [id number] Arrival date & time: 10/31/18  1929     History   Chief Complaint Chief Complaint  Patient presents with  . Laceration    HPI [name] is a 38 y.o. male who presents with a left thumb laceration.  Past medical history significant for hypertension, polysubstance abuse.  The patient states that he was using a knife to open up cutters this morning at about 8:30 AM when it sliced the top of his thumb open.  He has copiously irrigated it. The pain shoots up his arm to his shoulder. No weakness, numbness, tingling. He works as a Investment banker, operationalchef and couldn't work because the bleeding has continued tonight therefore he decided to come to the emergency department. He is unsure of tetanus status.  HPI  Past Medical History:  Diagnosis Date  . Anemia    baseline Hb- 12, microcytic  . Arthritis   . Gout   . Hypertension   . Polysubstance abuse (HCC)    alcohol,cocaine, marijuana, tobacco  . Rheumatoid arteritis (HCC)   . Tendonitis     Patient Active Problem List   Diagnosis Date Noted  . CPK, ABNORMAL 03/26/2010  . HIP PAIN, RIGHT 03/21/2010  . KNEE PAIN, RIGHT, ACUTE 02/02/2010  . GERD 04/20/2009  . HYPERTENSION NEC 01/11/2009    History reviewed. No pertinent surgical history.      Home Medications    Prior to Admission medications   Medication Sig Start Date End Date Taking? Authorizing Provider  cyclobenzaprine (FLEXERIL) 10 MG tablet Take 1 tablet (10 mg total) by mouth 2 (two) times daily as needed for muscle spasms. 02/01/17   Alene Miresmohundro, Jennifer C, NP  ibuprofen (ADVIL,MOTRIN) 800 MG tablet Take 1 tablet (800 mg total) by mouth 3 (three) times daily. 12/19/16   Fayrene Helperran, Bowie, PA-C  naproxen (NAPROSYN) 500 MG tablet Take 1 tablet (500 mg total) by mouth 2 (two) times daily. 04/17/18   Antony MaduraHumes, Marlisa Caridi, PA-C  naproxen sodium (ANAPROX) 220 MG tablet Take 440 mg by mouth every 12 (twelve) hours as  needed (PAIN).    [provider]  nitroGLYCERIN (NITROSTAT) 0.4 MG SL tablet Place 0.4 mg under the tongue every 5 (five) minutes as needed for chest pain.    [provider]  traMADol (ULTRAM) 50 MG tablet Take 1 tablet (50 mg total) by mouth every 6 (six) hours as needed for moderate pain or severe pain. 04/17/18   Antony MaduraHumes, Sharalyn Lomba, PA-C    Family History Family History  Problem Relation Age of Onset  . Hypertension Mother   . Hypertension Father     Social History Social History   Tobacco Use  . Smoking status: Current Every Day Smoker    Packs/day: 1.00    Types: Cigarettes  . Smokeless tobacco: Never Used  Substance Use Topics  . Alcohol use: Yes    Comment: occasionally  . Drug use: Yes    Types: Marijuana     Allergies   Amoxicillin; Ampicillin; Penicillins; and Robitussin (alcohol free) [guaifenesin]   Review of Systems Review of Systems  Musculoskeletal: Positive for arthralgias.  Skin: Positive for wound.     Physical Exam Updated Vital Signs BP (!) 118/91 (BP Location: Right Arm)   Pulse 94   Temp 98.3 F (36.8 C) (Oral)   Resp 17   SpO2 98%   Physical Exam Vitals signs and nursing note reviewed.  Constitutional:      General:  He is not in acute distress.    Appearance: Normal appearance. He is well-developed.     Comments: Calm and cooperative  HENT:     Head: Normocephalic and atraumatic.  Eyes:     General: No scleral icterus.       Right eye: No discharge.        Left eye: No discharge.     Conjunctiva/sclera: Conjunctivae normal.     Pupils: Pupils are equal, round, and reactive to light.  Neck:     Musculoskeletal: Normal range of motion.  Cardiovascular:     Rate and Rhythm: Normal rate.  Pulmonary:     Effort: Pulmonary effort is normal. No respiratory distress.  Abdominal:     General: There is no distension.  Musculoskeletal:     Comments: Left thumb: Healed over 1cm superficial laceration over dorsal aspect of  thumb extending from the nail bed to just distal to the IP joint. There is slight oozing of blood. FROM of thumb. 2+ radial pulse  Skin:    General: Skin is warm and dry.  Neurological:     Mental Status: He is alert and oriented to person, place, and time.  Psychiatric:        Behavior: Behavior normal.      ED Treatments / Results  Labs (all labs ordered are listed, but only abnormal results are displayed) Labs Reviewed - No data to display  EKG None  Radiology No results found.  Procedures .Marland KitchenLaceration Repair Date/Time: 10/31/2018 8:34 PM Performed by: Bethel Born, PA-C Authorized by: Bethel Born, PA-C   Consent:    Consent obtained:  Verbal   Consent given by:  Patient   Risks discussed:  Infection and pain   Alternatives discussed:  No treatment Anesthesia (see MAR for exact dosages):    Anesthesia method:  Local infiltration Laceration details:    Location:  Finger   Finger location:  L thumb   Length (cm):  1   Depth (mm):  1 Repair type:    Repair type:  Simple Pre-procedure details:    Preparation:  Patient was prepped and draped in usual sterile fashion Exploration:    Wound exploration: wound explored through full range of motion and entire depth of wound probed and visualized   Treatment:    Area cleansed with:  Soap and water   Amount of cleaning:  Standard   Irrigation solution:  Tap water   Irrigation method:  Tap   Visualized foreign bodies/material removed: no   Skin repair:    Repair method:  Tissue adhesive Approximation:    Approximation:  Close Post-procedure details:    Patient tolerance of procedure:  Tolerated well, no immediate complications   (including critical care time)    Medications Ordered in ED Medications  naproxen (NAPROSYN) tablet 500 mg (500 mg Oral Given 10/31/18 1954)     Initial Impression / Assessment and Plan / ED Course  I have reviewed the triage vital signs and the nursing  notes.  Pertinent labs & imaging results that were available during my care of the patient were reviewed by me and considered in my medical decision making (see chart for details).  38  year old with left thumb laceration this morning. It was irrigated in the ED. There is still oozing of blood but wound has healed over and does not open up with ROM of the thumb. Dermabond was used to seal it and stop the oozing of blood. He was  recommended to have his tetanus updated but refused.   Return precautions discussed.    Final Clinical Impressions(s) / ED Diagnoses   Final diagnoses:  Laceration of left thumb without damage to nail, foreign body presence unspecified, initial encounter    ED Discharge Orders    None       Bethel Born, PA-C 10/31/18 2036    Tegeler, Canary Brim, MD 11/01/18 0025

## 2018-10-31 NOTE — Discharge Instructions (Signed)
Do not put any ointment on the wound because this will make the glue dissolve faster than it's supposed to  Do not submerge the glue in water. The glue is water resistant but it shouldn't be soaked. You can place a bandage over it if you prefer but you don't have to The glue will come off on its own in 5-7 days Please return if worsening

## 2018-10-31 NOTE — ED Triage Notes (Signed)
Laceration to left thumb this morning.

## 2018-10-31 NOTE — ED Notes (Signed)
Patient verbalizes understanding of discharge instructions. Opportunity for questioning and answers were provided. Armband removed by staff, pt discharged from ED.  

## 2019-05-01 ENCOUNTER — Encounter (HOSPITAL_COMMUNITY): Payer: Self-pay

## 2019-05-01 ENCOUNTER — Ambulatory Visit (HOSPITAL_COMMUNITY)
Admission: EM | Admit: 2019-05-01 | Discharge: 2019-05-01 | Disposition: A | Discharge status: Home / Self Care | Payer: Self-pay | Attending: Family Medicine | Admitting: Family Medicine

## 2019-05-01 ENCOUNTER — Other Ambulatory Visit: Payer: Self-pay

## 2019-05-01 DIAGNOSIS — H5711 Ocular pain, right eye: Secondary | ICD-10-CM

## 2019-05-01 DIAGNOSIS — H109 Unspecified conjunctivitis: Secondary | ICD-10-CM

## 2019-05-01 MED ORDER — POLYMYXIN B-TRIMETHOPRIM 10000-0.1 UNIT/ML-% OP SOLN: 2.0000 [drp] | Freq: Four times a day (QID) | OPHTHALMIC | 0 refills | Status: DC

## 2019-05-01 NOTE — ED Provider Notes (Signed)
[facility]    CSN: [id number] Arrival date & time: 05/01/19  1551      History   Chief Complaint Chief Complaint  Patient presents with   Eye Pain    HPI [name] is a 38 y.o. male.   HPI  Complains of two days of worsening right eye irritation, tearing, and pain. Pain is most pronounced at the right eye inner canthus along with upper eye lid pain. He denies know entry of foreign body. Endorses a habit of rubbing eyes. He denies vision changes, uri symptoms, pressure or pain behind the eye. He doesn't wear contact lens. He has not attempted relief with any over the counter medication. Past Medical History:  Diagnosis Date   Anemia    baseline Hb- 12, microcytic   Arthritis    Gout    Hypertension    Polysubstance abuse (HCC)    alcohol,cocaine, marijuana, tobacco   Rheumatoid arteritis (HCC)    Tendonitis     Patient Active Problem List   Diagnosis Date Noted   CPK, ABNORMAL 03/26/2010   HIP PAIN, RIGHT 03/21/2010   KNEE PAIN, RIGHT, ACUTE 02/02/2010   GERD 04/20/2009   HYPERTENSION NEC 01/11/2009    History reviewed. No pertinent surgical history.     Home Medications    Prior to Admission medications   Medication Sig Start Date End Date Taking? Authorizing Provider  cyclobenzaprine (FLEXERIL) 10 MG tablet Take 1 tablet (10 mg total) by mouth 2 (two) times daily as needed for muscle spasms. 02/01/17   Alene Mires, NP  ibuprofen (ADVIL,MOTRIN) 800 MG tablet Take 1 tablet (800 mg total) by mouth 3 (three) times daily. 12/19/16   Fayrene Helper, PA-C  naproxen (NAPROSYN) 500 MG tablet Take 1 tablet (500 mg total) by mouth 2 (two) times daily. 04/17/18   Antony Madura, PA-C  naproxen sodium (ANAPROX) 220 MG tablet Take 440 mg by mouth every 12 (twelve) hours as needed (PAIN).    [provider]  nitroGLYCERIN (NITROSTAT) 0.4 MG SL tablet Place 0.4 mg under the tongue every 5 (five) minutes as needed for chest pain.     [provider]  traMADol (ULTRAM) 50 MG tablet Take 1 tablet (50 mg total) by mouth every 6 (six) hours as needed for moderate pain or severe pain. 04/17/18   Antony Madura, PA-C  trimethoprim-polymyxin b (POLYTRIM) ophthalmic solution Place 2 drops into the right eye every 6 (six) hours. 05/01/19   Bing Neighbors, FNP    Family History Family History  Problem Relation Age of Onset   Hypertension Mother    Hypertension Father     Social History Social History   Tobacco Use   Smoking status: Current Every Day Smoker    Packs/day: 1.00    Types: Cigarettes   Smokeless tobacco: Never Used  Substance Use Topics   Alcohol use: Yes    Comment: occasionally   Drug use: Yes    Types: Marijuana     Allergies   Amoxicillin, Ampicillin, Penicillins, and Robitussin (alcohol free) [guaifenesin]   Review of Systems Review of Systems Pertinent negatives listed in HPI Physical Exam Triage Vital Signs ED Triage Vitals  Enc Vitals Group     BP 05/01/19 1701 131/88     Pulse Rate 05/01/19 1701 90     Resp 05/01/19 1701 18     Temp 05/01/19 1701 98.8 F (37.1 C)     Temp Source 05/01/19 1701 Oral  SpO2 05/01/19 1701 100 %     Weight 05/01/19 1658 234 lb (106.1 kg)     Height --      Head Circumference --      Peak Flow --      Pain Score 05/01/19 1658 9     Pain Loc --      Pain Edu? --      Excl. in [place]? --    No data found.  Updated Vital Signs BP 131/88 (BP Location: Right Arm)    Pulse 90    Temp 98.8 F (37.1 C) (Oral)    Resp 18    Wt 234 lb (106.1 kg)    SpO2 100%    BMI 36.65 kg/m   Visual Acuity Right Eye Distance:   Left Eye Distance:   Bilateral Distance:    Right Eye Near:   Left Eye Near:    Bilateral Near:     Physical Exam Eyes:     General:        Right eye: Discharge present. No foreign body or hordeolum.     Extraocular Movements: Extraocular movements intact.     Conjunctiva/sclera:     Right eye: Right conjunctiva is  injected. Hemorrhage present.  Cardiovascular:     Rate and Rhythm: Normal rate and regular rhythm.  Pulmonary:     Effort: Pulmonary effort is normal.     Breath sounds: Normal breath sounds.  Neurological:     Mental Status: He is alert and oriented to person, place, and time.     GCS: GCS eye subscore is 4. GCS verbal subscore is 5. GCS motor subscore is 6.    UC Treatments / Results  Labs (all labs ordered are listed, but only abnormal results are displayed) Labs Reviewed - No data to display  EKG   Radiology No results found.  Procedures Procedures (including critical care time)  Medications Ordered in UC Medications - No data to display  Initial Impression / Assessment and Plan / UC Course  I have reviewed the triage vital signs and the nursing notes.  Pertinent labs & imaging results that were available during my care of the patient were reviewed by me and considered in my medical decision making (see chart for details).   Will treat empirically for conjunctivitis. Prescribed trimethoprim-polymyxin B, 2 drops to right eye every 6 hours x 7 days. Work note provided for missing work today. Red flags discussed. Patient verbalized understanding and agreement with plan.   Final Clinical Impressions(s) / UC Diagnoses   Final diagnoses:  Conjunctivitis of right eye, unspecified conjunctivitis type  Pain of right eye   Discharge Instructions   None    ED Prescriptions    Medication Sig Dispense Auth. Provider   trimethoprim-polymyxin b (POLYTRIM) ophthalmic solution Place 2 drops into the right eye every 6 (six) hours. 10 mL Scot Jun, FNP     Controlled Substance Prescriptions  Controlled Substance Registry consulted? Not Applicable   Scot Jun, FNP 05/02/19 [phone]

## 2019-05-01 NOTE — ED Triage Notes (Addendum)
Pt states his eye has been draining from a stretch pt states he stretched his eye by mistake. ( right eye pain )

## 2020-02-04 ENCOUNTER — Emergency Department (HOSPITAL_COMMUNITY): Payer: Self-pay

## 2020-02-04 ENCOUNTER — Emergency Department (HOSPITAL_COMMUNITY)
Admission: EM | Admit: 2020-02-04 | Discharge: 2020-02-05 | Disposition: A | Discharge status: Home / Self Care | Payer: Self-pay | Attending: Emergency Medicine | Admitting: Emergency Medicine

## 2020-02-04 ENCOUNTER — Other Ambulatory Visit: Payer: Self-pay

## 2020-02-04 ENCOUNTER — Encounter (HOSPITAL_COMMUNITY): Payer: Self-pay | Admitting: *Deleted

## 2020-02-04 DIAGNOSIS — I10 Essential (primary) hypertension: Secondary | ICD-10-CM | POA: Insufficient documentation

## 2020-02-04 DIAGNOSIS — F17210 Nicotine dependence, cigarettes, uncomplicated: Secondary | ICD-10-CM | POA: Insufficient documentation

## 2020-02-04 DIAGNOSIS — Z79899 Other long term (current) drug therapy: Secondary | ICD-10-CM | POA: Insufficient documentation

## 2020-02-04 DIAGNOSIS — Y9389 Activity, other specified: Secondary | ICD-10-CM | POA: Insufficient documentation

## 2020-02-04 DIAGNOSIS — Y9289 Other specified places as the place of occurrence of the external cause: Secondary | ICD-10-CM | POA: Insufficient documentation

## 2020-02-04 DIAGNOSIS — Y999 Unspecified external cause status: Secondary | ICD-10-CM | POA: Insufficient documentation

## 2020-02-04 DIAGNOSIS — S46219A Strain of muscle, fascia and tendon of other parts of biceps, unspecified arm, initial encounter: Secondary | ICD-10-CM

## 2020-02-04 DIAGNOSIS — X500XXA Overexertion from strenuous movement or load, initial encounter: Secondary | ICD-10-CM | POA: Insufficient documentation

## 2020-02-04 DIAGNOSIS — S46212A Strain of muscle, fascia and tendon of other parts of biceps, left arm, initial encounter: Secondary | ICD-10-CM | POA: Insufficient documentation

## 2020-02-04 NOTE — ED Triage Notes (Signed)
Pt states he was moving a dresser on Tuesday and has had left shoulder pain since, can move arm with pain. Move tender anterior with movement than posterior

## 2020-02-05 ENCOUNTER — Emergency Department (HOSPITAL_COMMUNITY): Payer: Self-pay

## 2020-02-05 MED ORDER — IBUPROFEN 600 MG PO TABS: 600.0000 mg | ORAL_TABLET | Freq: Four times a day (QID) | ORAL | 0 refills | Status: DC | PRN

## 2020-02-05 MED ORDER — CYCLOBENZAPRINE HCL 10 MG PO TABS
10.0000 mg | ORAL_TABLET | Freq: Once | ORAL | Status: AC
  Administered 2020-02-05: 10 mg via ORAL
  Filled 2020-02-05: qty 1

## 2020-02-05 MED ORDER — KETOROLAC TROMETHAMINE 60 MG/2ML IM SOLN
30.0000 mg | Freq: Once | INTRAMUSCULAR | Status: AC
  Administered 2020-02-05: 30 mg via INTRAMUSCULAR
  Filled 2020-02-05: qty 2

## 2020-02-05 MED ORDER — CYCLOBENZAPRINE HCL 10 MG PO TABS: 10.0000 mg | ORAL_TABLET | Freq: Two times a day (BID) | ORAL | 0 refills | Status: DC | PRN

## 2020-02-05 NOTE — ED Provider Notes (Signed)
[facility]-EMERGENCY DEPT Provider Note   CSN: [id number] Arrival date & time: 02/04/20  2157     History Chief Complaint  Patient presents with  . Shoulder Pain    [name] is a 39 y.o. male with a history of polysubstance abuse, alcohol use disorder, cocaine use disorder, cannabis use disorder, and tobacco use disorder presents to the emergency department with a chief complaint of left upper arm pain.  The patient has been moving apartments.  Three days ago, he was carrying a heavy dresser.  Sometime after this event, he developed pain in his left upper arm.  Reports that he was able to continue the rest of his meds for the day.he does not recall any specific injury while he was carrying the dresser and view: Of the flight of stairs.    Pain is sharp, severe, and worse with movement of the upper arm or shoulder.  The pain radiates from the shoulder down the upper arm to the elbow.  Pain is worse with movement and improved with rest.  He denies left elbow pain, numbness, weakness, neck pain, chest pain, shortness of breath, fever, or chills.  Reports a history of a left clavicle fracture.  No history of injury to the left shoulder upper arm.  No treatment prior to arrival.  The history is provided by the patient. No language interpreter was used.       Past Medical History:  Diagnosis Date  . Anemia    baseline Hb- 12, microcytic  . Arthritis   . Gout   . Hypertension   . Polysubstance abuse (HCC)    alcohol,cocaine, marijuana, tobacco  . Rheumatoid arteritis (HCC)   . Tendonitis     Patient Active Problem List   Diagnosis Date Noted  . CPK, ABNORMAL 03/26/2010  . HIP PAIN, RIGHT 03/21/2010  . KNEE PAIN, RIGHT, ACUTE 02/02/2010  . GERD 04/20/2009  . HYPERTENSION NEC 01/11/2009    History reviewed. No pertinent surgical history.     Family History  Problem Relation Age of Onset  . Hypertension Mother   . Hypertension Father      Social History   Tobacco Use  . Smoking status: Current Every Day Smoker    Packs/day: 1.00    Types: Cigarettes  . Smokeless tobacco: Never Used  Substance Use Topics  . Alcohol use: Yes    Comment: occasionally  . Drug use: Yes    Types: Marijuana    Home Medications Prior to Admission medications   Medication Sig Start Date End Date Taking? Authorizing Provider  cyclobenzaprine (FLEXERIL) 10 MG tablet Take 1 tablet (10 mg total) by mouth 2 (two) times daily as needed for muscle spasms. 02/05/20   Leonardo Makris A, PA-C  ibuprofen (ADVIL) 600 MG tablet Take 1 tablet (600 mg total) by mouth every 6 (six) hours as needed. 02/05/20   Mari Battaglia A, PA-C  naproxen (NAPROSYN) 500 MG tablet Take 1 tablet (500 mg total) by mouth 2 (two) times daily. 04/17/18   Antony Madura, PA-C  naproxen sodium (ANAPROX) 220 MG tablet Take 440 mg by mouth every 12 (twelve) hours as needed (PAIN).    [provider]  nitroGLYCERIN (NITROSTAT) 0.4 MG SL tablet Place 0.4 mg under the tongue every 5 (five) minutes as needed for chest pain.    [provider]  traMADol (ULTRAM) 50 MG tablet Take 1 tablet (50 mg total) by mouth every 6 (six) hours as needed for moderate pain  or severe pain. 04/17/18   Antonietta Breach, PA-C  trimethoprim-polymyxin b (POLYTRIM) ophthalmic solution Place 2 drops into the right eye every 6 (six) hours. 05/01/19   Scot Jun, FNP    Allergies    Amoxicillin, Ampicillin, Penicillins, and Robitussin (alcohol free) [guaifenesin]  Review of Systems   Review of Systems  Constitutional: Negative for activity change, chills and fever.  Respiratory: Negative for shortness of breath.   Cardiovascular: Negative for chest pain.  Gastrointestinal: Negative for abdominal pain.  Musculoskeletal: Positive for arthralgias and myalgias. Negative for back pain, gait problem, joint swelling, neck pain and neck stiffness.  Skin: Negative for rash.  Neurological: Negative  for dizziness, weakness and numbness.    Physical Exam Updated Vital Signs BP 131/87 (BP Location: Right Arm)   Pulse 61   Temp 98.1 F (36.7 C) (Oral)   Resp 16   Ht 5\' 7"  (1.702 m)   Wt 108.9 kg   SpO2 100%   BMI 37.59 kg/m   Physical Exam Vitals and nursing note reviewed.  Constitutional:      Appearance: He is well-developed. He is not ill-appearing or toxic-appearing.  HENT:     Head: Normocephalic.  Eyes:     Conjunctiva/sclera: Conjunctivae normal.  Cardiovascular:     Rate and Rhythm: Normal rate and regular rhythm.     Heart sounds: No murmur.  Pulmonary:     Effort: Pulmonary effort is normal. No respiratory distress.     Breath sounds: No stridor. No wheezing, rhonchi or rales.  Chest:     Chest wall: No tenderness.  Abdominal:     General: There is no distension.     Palpations: Abdomen is soft.  Musculoskeletal:     Cervical back: Neck supple.     Comments: Diffusely tender to palpation to the left biceps brachii.  Maximal tenderness is over the origin of the long head of the biceps brachii but the head of the humerus and to the insertion point of the tendon.  However, he is diffusely tender along the body of the muscle.  No asymmetric bulge of the left biceps brachii.  No overlying ecchymosis, redness, warmth, or swelling noted to the left upper arm.  Muscular compartments are soft.  No pain with passive range of motion of the left shoulder.  Increased pain with active range of motion the left shoulder.  He has full active and passive range of motion of the left shoulder intact.  No tenderness of the left clavicle.  Normal exam of the left elbow and wrist.  Neurovascularly intact to the left upper extremity.  No tenderness to the cervical, thoracic, or lumbar spinous processes or bilateral paraspinal muscles.    Skin:    General: Skin is warm and dry.     Findings: No erythema.  Neurological:     Mental Status: He is alert.  Psychiatric:         Behavior: Behavior normal.     ED Results / Procedures / Treatments   Labs (all labs ordered are listed, but only abnormal results are displayed) Labs Reviewed - No data to display  EKG None  Radiology DG Shoulder Left  Result Date: 02/04/2020 CLINICAL DATA:  Pain EXAM: LEFT SHOULDER - 2+ VIEW COMPARISON:  02/01/2017 FINDINGS: There is no evidence of fracture or dislocation. Mild acromioclavicular arthrosis. The glenohumeral joint is preserved. Soft tissues are unremarkable. IMPRESSION: No fracture or dislocation of the left shoulder. Mild acromioclavicular arthrosis. The glenohumeral joint is preserved.  Electronically Signed   By: Lauralyn Primes M.D.   On: 02/04/2020 22:35   DG Humerus Left  Result Date: 02/05/2020 CLINICAL DATA:  Left upper arm pain after lifting furniture EXAM: LEFT HUMERUS - 2+ VIEW COMPARISON:  Shoulder radiographs 02/04/2020, 02/01/2017 FINDINGS: Stable appearance of a remote posttraumatic deformity of the mid left and acromion. No acute bony abnormality. Specifically, no fracture, subluxation, or dislocation. Humeral head is normally seated within the glenoid. Mild glenohumeral and acromioclavicular arthrosis. No acute abnormality of the adjacent chest wall or included portion of the left lung. IMPRESSION: 1. No acute osseous abnormality. 2. Stable appearance of a remote posttraumatic deformity of the mid left and acromion. 3. Mild glenohumeral and acromioclavicular arthrosis. Electronically Signed   By: Kreg Shropshire M.D.   On: 02/05/2020 01:33    Procedures Procedures (including critical care time)  Medications Ordered in ED Medications  ketorolac (TORADOL) injection 30 mg (30 mg Intramuscular Given 02/05/20 0135)  cyclobenzaprine (FLEXERIL) tablet 10 mg (10 mg Oral Given 02/05/20 0135)    ED Course  I have reviewed the triage vital signs and the nursing notes.  Pertinent labs & imaging results that were available during my care of the patient were reviewed by  me and considered in my medical decision making (see chart for details).    MDM Rules/Calculators/A&P                      39 year old male with a history of polysubstance abuse, alcohol use disorder, cocaine use disorder, cannabis use disorder, and tobacco use disorder with left upper arm pain after carrying a dresser while moving 3 days ago.  He is unsure of onset of pain, but does note that it did not occur immediately while he was carrying the dresser.  X-ray of the left shoulder was ordered by triage and was negative for fracture dislocation.  There was mild acromioclavicular arthrosis.  X-ray of the left humerus ordered by me demonstrating a stable remote posttraumatic deformity of the mid left and acromion.  No evidence of dislocation, subluxation, or fracture on my read of the image.  On exam, his pain is markedly increased with active range of motion the left shoulder.  He is also diffusely tender to the left biceps brachii with maximal tenderness over the tendons.  Considered rupture of the biceps brachii, but this is not consistent with exam as there are no bulges.  Is also less likely given that pain did not start immediately while he was moving.  I suspect there is a component of tendinitis as well as a musculoskeletal strain.  He was given Toradol and Flexeril in the ER and on reevaluation pain was significantly improved.  Doubt occult fracture, gout, or septic arthritis.  We will discharge the patient with RICE therapy to sports medicine if symptoms do not significantly improve.  He is hemodynamically stable and in no acute distress.  Safe for discharge home with outpatient follow-up as needed.  Final Clinical Impression(s) / ED Diagnoses Final diagnoses:  Strain of biceps brachii    Rx / DC Orders ED Discharge Orders         Ordered    cyclobenzaprine (FLEXERIL) 10 MG tablet  2 times daily PRN     02/05/20 0240    ibuprofen (ADVIL) 600 MG tablet  Every 6 hours PRN      02/05/20 0240           Windel Keziah, Pedro Earls A, PA-C 02/05/20 [id number]  Zadie Rhine, MD 02/06/20 [id number]

## 2020-02-05 NOTE — Discharge Instructions (Signed)
Thank you for allowing me to care for you today in the Emergency Department.   He was seen today in the ER for pain of your left upper arm and shoulder.  There was no evidence of a broken bone/fracture on your x-ray.  I suspect that you have tendinitis of your biceps brachii muscle and possibly a strain or tear of the muscle.  Take 650 mg of Tylenol or 600 mg of ibuprofen with food every 6 hours for pain.  You can alternate between these 2 medications every 3 hours if your pain returns.  For instance, you can take Tylenol at noon, followed by a dose of ibuprofen at 3, followed by second dose of Tylenol and 6.  Apply ice packs or heating pads to areas that are sore for 15 to 20 minutes up to 3-4 times a day over the next 5 days.  You can take 1 tablet of Flexeril up to 2 times daily to help with muscle pain and spasms.  Do not work or drive while taking this medication as it can make you drowsy.  Try not to do any heavy lifting until your pain significantly improves, especially over the weekend.  This will worsen your symptoms if you continue to try and do heavy lifting until your injury heals.  Follow-up with sports medicine if your symptoms do not improve with this regimen in the next week.  Return to the emergency department if you have any fall or injury, if you develop new numbness or weakness in the hand or arm, if of the upper arm gets very swollen, hard to the touch, and very red, if your fingers turn blue, or if you develop other new symptoms.

## 2020-05-03 ENCOUNTER — Ambulatory Visit (INDEPENDENT_AMBULATORY_CARE_PROVIDER_SITE_OTHER): Payer: Self-pay

## 2020-05-03 ENCOUNTER — Encounter (HOSPITAL_COMMUNITY): Payer: Self-pay

## 2020-05-03 ENCOUNTER — Ambulatory Visit (HOSPITAL_COMMUNITY)
Admission: EM | Admit: 2020-05-03 | Discharge: 2020-05-03 | Disposition: A | Discharge status: Home / Self Care | Payer: Self-pay | Attending: Internal Medicine | Admitting: Internal Medicine

## 2020-05-03 DIAGNOSIS — M79641 Pain in right hand: Secondary | ICD-10-CM

## 2020-05-03 DIAGNOSIS — S63501A Unspecified sprain of right wrist, initial encounter: Secondary | ICD-10-CM

## 2020-05-03 DIAGNOSIS — M7989 Other specified soft tissue disorders: Secondary | ICD-10-CM

## 2020-05-03 NOTE — Discharge Instructions (Signed)
Your x-ray was negative for any break.  Continue icing hand frequently throughout the day 15 to 20 minutes at a time.  You can take Motrin 600 mg every 8 hours as needed for pain.  If pain persist you can call sports medicine for follow-up

## 2020-05-03 NOTE — ED Triage Notes (Signed)
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 States he can not make a fist. Pt took ibuprofen at 9 am, without relief.

## 2020-05-03 NOTE — ED Provider Notes (Signed)
[facility]    CSN: [id number] Arrival date & time: 05/03/20  1646      History   Chief Complaint Chief Complaint  Patient presents with  . Hand Injury  . Wrist Pain    HPI [name] is a 39 y.o. male with past medical history of hypertension, polysubstance abuse, RA presents to urgent care with complaints of right hand pain and swelling.  Patient states he was moving a piece of furniture at work when he heard a popping noise and experienced pain in right wrist extending throughout hand.  Patient also with mild swelling to affected extremity.  RA listed on patient's history though he states this is not been an active problem for him.  Patient denies any numbness or tingling, no loss of sensation.    Past Medical History:  Diagnosis Date  . Anemia    baseline Hb- 12, microcytic  . Arthritis   . Gout   . Hypertension   . Polysubstance abuse (HCC)    alcohol,cocaine, marijuana, tobacco  . Rheumatoid arteritis (HCC)   . Tendonitis     Patient Active Problem List   Diagnosis Date Noted  . CPK, ABNORMAL 03/26/2010  . HIP PAIN, RIGHT 03/21/2010  . KNEE PAIN, RIGHT, ACUTE 02/02/2010  . GERD 04/20/2009  . HYPERTENSION NEC 01/11/2009    History reviewed. No pertinent surgical history.     Home Medications    Prior to Admission medications   Medication Sig Start Date End Date Taking? Authorizing Provider  cyclobenzaprine (FLEXERIL) 10 MG tablet Take 1 tablet (10 mg total) by mouth 2 (two) times daily as needed for muscle spasms. 02/05/20   McDonald, Mia A, PA-C  ibuprofen (ADVIL) 600 MG tablet Take 1 tablet (600 mg total) by mouth every 6 (six) hours as needed. 02/05/20   McDonald, Mia A, PA-C  naproxen (NAPROSYN) 500 MG tablet Take 1 tablet (500 mg total) by mouth 2 (two) times daily. 04/17/18   Antony Madura, PA-C  naproxen sodium (ANAPROX) 220 MG tablet Take 440 mg by mouth every 12 (twelve) hours as needed (PAIN).    [provider]    nitroGLYCERIN (NITROSTAT) 0.4 MG SL tablet Place 0.4 mg under the tongue every 5 (five) minutes as needed for chest pain.    [provider]  traMADol (ULTRAM) 50 MG tablet Take 1 tablet (50 mg total) by mouth every 6 (six) hours as needed for moderate pain or severe pain. 04/17/18   Antony Madura, PA-C  trimethoprim-polymyxin b (POLYTRIM) ophthalmic solution Place 2 drops into the right eye every 6 (six) hours. 05/01/19   Bing Neighbors, FNP    Family History Family History  Problem Relation Age of Onset  . Hypertension Mother   . Hypertension Father     Social History Social History   Tobacco Use  . Smoking status: Current Every Day Smoker    Packs/day: 1.00    Types: Cigarettes  . Smokeless tobacco: Never Used  Substance Use Topics  . Alcohol use: Yes    Comment: occasionally  . Drug use: Yes    Types: Marijuana     Allergies   Amoxicillin, Ampicillin, Penicillins, and Robitussin (alcohol free) [guaifenesin]   Review of Systems As stated in HPI otherwise negative   Physical Exam Triage Vital Signs ED Triage Vitals  Enc Vitals Group     BP 05/03/20 1734 116/74     Pulse Rate 05/03/20 1734 80     Resp 05/03/20 1734 20  Temp 05/03/20 1734 98.5 F (36.9 C)     Temp Source 05/03/20 1734 Oral     SpO2 05/03/20 1734 97 %     Weight --      Height --      Roberts Circumference --      Peak Flow --      Pain Score 05/03/20 1732 8     Pain Loc --      Pain Edu? --      Excl. in GC? --    No data found.  Updated Vital Signs BP 116/74 (BP Location: Right Arm)   Pulse 80   Temp 98.5 F (36.9 C) (Oral)   Resp 20   SpO2 97%      Physical Exam Constitutional:      General: He is not in acute distress.    Appearance: Normal appearance.  Musculoskeletal:        General: Swelling and tenderness present. No deformity. Normal range of motion.     Comments: Swelling and mild bruising to dorsum of right hand.  No obvious deformity.  Normal range of  motion.  2+ radial pulse, sensation intact  Neurological:     Mental Status: He is alert.      UC Treatments / Results  Labs (all labs ordered are listed, but only abnormal results are displayed) Labs Reviewed - No data to display  EKG   Radiology DG Hand Complete Right  Result Date: 05/03/2020 CLINICAL DATA:  Right hand pain after picking up a dresser and felt a pop. Pain and swelling. EXAM: RIGHT HAND - COMPLETE 3+ VIEW COMPARISON:  None. FINDINGS: There is no evidence of fracture or dislocation. There is no evidence of arthropathy or other focal bone abnormality. Mild generalized soft tissue edema. IMPRESSION: Mild generalized soft tissue edema. No fracture or subluxation. Electronically Signed   By: Narda Rutherford M.D.   On: 05/03/2020 18:50    Procedures Procedures (including critical care time)  Medications Ordered in UC Medications - No data to display  Initial Impression / Assessment and Plan / UC Course  I have reviewed the triage vital signs and the nursing notes.  Pertinent labs & imaging results that were available during my care of the patient were reviewed by me and considered in my medical decision making (see chart for details).  Right wrist sprain -No evidence of fracture on x-ray.  Exam not consistent with RA -Ace wrap, ice, NSAIDs as needed -Follow-up Ortho if pain persist  Final Clinical Impressions(s) / UC Diagnoses   Final diagnoses:  Sprain of right wrist, initial encounter     Discharge Instructions     Your x-ray was negative for any break.  Continue icing hand frequently throughout the day 15 to 20 minutes at a time.  You can take Motrin 600 mg every 8 hours as needed for pain.  If pain persist you can call sports medicine for follow-up   ED Prescriptions    None     PDMP not reviewed this encounter.   Rolla Etienne, NP 05/03/20 [place] Moment

## 2020-10-07 ENCOUNTER — Other Ambulatory Visit: Payer: Self-pay

## 2020-10-07 ENCOUNTER — Emergency Department (HOSPITAL_COMMUNITY)
Admission: EM | Admit: 2020-10-07 | Discharge: 2020-10-08 | Disposition: A | Discharge status: Home / Self Care | Payer: Self-pay | Attending: Emergency Medicine | Admitting: Emergency Medicine

## 2020-10-07 DIAGNOSIS — R079 Chest pain, unspecified: Secondary | ICD-10-CM | POA: Insufficient documentation

## 2020-10-07 DIAGNOSIS — Z5321 Procedure and treatment not carried out due to patient leaving prior to being seen by health care provider: Secondary | ICD-10-CM | POA: Insufficient documentation

## 2020-10-07 LAB — BASIC METABOLIC PANEL
Anion gap: 11 (ref 5–15)
BUN: 10 mg/dL (ref 6–20)
CO2: 23 mmol/L (ref 22–32)
Calcium: 9.3 mg/dL (ref 8.9–10.3)
Chloride: 104 mmol/L (ref 98–111)
Creatinine, Ser: 1 mg/dL (ref 0.61–1.24)
GFR, Estimated: >60 mL/min (ref >60)
Glucose, Bld: 105 mg/dL — ABNORMAL HIGH (ref 70–99)
Potassium: 3.8 mmol/L (ref 3.5–5.1)
Sodium: 138 mmol/L (ref 135–145)

## 2020-10-07 LAB — CBC
HCT: 40.2 % (ref 39.0–52.0)
Hemoglobin: 13.3 g/dL (ref 13.0–17.0)
MCH: 22.7 pg — ABNORMAL LOW (ref 26.0–34.0)
MCHC: 33.1 g/dL (ref 30.0–36.0)
MCV: 68.6 fL — ABNORMAL LOW (ref 80.0–100.0)
Platelets: 274 10*3/uL (ref 150–400)
RBC: 5.86 MIL/uL — ABNORMAL HIGH (ref 4.22–5.81)
RDW: 13.8 % (ref 11.5–15.5)
WBC: 15.3 10*3/uL — ABNORMAL HIGH (ref 4.0–10.5)
nRBC: 0 % (ref 0.0–0.2)

## 2020-10-07 NOTE — ED Triage Notes (Signed)
Pt presents to ED POV. Pt c/o L CP that radiates to L chest.

## 2020-10-08 LAB — TROPONIN I (HIGH SENSITIVITY)
Troponin I (High Sensitivity): 5 ng/L (ref <18)
Troponin I (High Sensitivity): 5 ng/L (ref <18)

## 2020-10-08 NOTE — ED Notes (Addendum)
Pt storms out of hospital and cursing at staff

## 2021-03-14 ENCOUNTER — Ambulatory Visit (HOSPITAL_COMMUNITY)
Admission: EM | Admit: 2021-03-14 | Discharge: 2021-03-14 | Disposition: A | Discharge status: Home / Self Care | Payer: PRIVATE HEALTH INSURANCE | Attending: Internal Medicine | Admitting: Internal Medicine

## 2021-03-14 ENCOUNTER — Encounter (HOSPITAL_COMMUNITY): Payer: Self-pay

## 2021-03-14 ENCOUNTER — Other Ambulatory Visit: Payer: Self-pay

## 2021-03-14 DIAGNOSIS — S29011A Strain of muscle and tendon of front wall of thorax, initial encounter: Secondary | ICD-10-CM | POA: Diagnosis not present

## 2021-03-14 DIAGNOSIS — S161XXA Strain of muscle, fascia and tendon at neck level, initial encounter: Secondary | ICD-10-CM

## 2021-03-14 MED ORDER — KETOROLAC TROMETHAMINE 60 MG/2ML IM SOLN
60.0000 mg | Freq: Once | INTRAMUSCULAR | Status: AC
  Administered 2021-03-14: 60 mg via INTRAMUSCULAR

## 2021-03-14 MED ORDER — DEXAMETHASONE SODIUM PHOSPHATE 10 MG/ML IJ SOLN
INTRAMUSCULAR | Status: AC
  Filled 2021-03-14: qty 1

## 2021-03-14 MED ORDER — CYCLOBENZAPRINE HCL 10 MG PO TABS: 10.0000 mg | ORAL_TABLET | Freq: Two times a day (BID) | ORAL | 0 refills | Status: DC | PRN

## 2021-03-14 MED ORDER — KETOROLAC TROMETHAMINE 60 MG/2ML IM SOLN
INTRAMUSCULAR | Status: AC
  Filled 2021-03-14: qty 2

## 2021-03-14 MED ORDER — DEXAMETHASONE SODIUM PHOSPHATE 10 MG/ML IJ SOLN
10.0000 mg | Freq: Once | INTRAMUSCULAR | Status: AC
  Administered 2021-03-14: 10 mg via INTRAMUSCULAR

## 2021-03-14 NOTE — ED Provider Notes (Signed)
[facility]    CSN: [id number] Arrival date & time: 03/14/21  1962      History   Chief Complaint Chief Complaint  Patient presents with  . Shoulder Pain  . Shortness of Breath    HPI [name] is a 40 y.o. male.   Patient presenting today with 4-day history of what started as right neck stiffness and pain but has developed into right-sided neck pain radiating down into right pectoral muscle and now also right upper back with movement.  Having muscle spasms with any amount of movement to this area.  States he lifts heavy boxes at [employer] every day for work and thinks he may have lifted something wrong recently.  He denies swelling, discoloration, chest pain, shortness of breath, dizziness, syncope, abdominal pain, nausea vomiting or diarrhea.  So far taking over-the-counter pain relievers without any relief.        Past Medical History:  Diagnosis Date  . Anemia    baseline Hb- 12, microcytic  . Arthritis   . Gout   . Hypertension   . Polysubstance abuse (HCC)    alcohol,cocaine, marijuana, tobacco  . Rheumatoid arteritis (HCC)   . Tendonitis    Patient Active Problem List   Diagnosis Date Noted  . CPK, ABNORMAL 03/26/2010  . HIP PAIN, RIGHT 03/21/2010  . KNEE PAIN, RIGHT, ACUTE 02/02/2010  . GERD 04/20/2009  . HYPERTENSION NEC 01/11/2009    History reviewed. No pertinent surgical history.     Home Medications    Prior to Admission medications   Medication Sig Start Date End Date Taking? Authorizing Provider  cyclobenzaprine (FLEXERIL) 10 MG tablet Take 1 tablet (10 mg total) by mouth 2 (two) times daily as needed for muscle spasms. Not drink alcohol or drive while taking this medication.  May cause drowsiness. 03/14/21   Particia Nearing, PA-C  ibuprofen (ADVIL) 600 MG tablet Take 1 tablet (600 mg total) by mouth every 6 (six) hours as needed. 02/05/20   McDonald, Mia A, PA-C  naproxen (NAPROSYN) 500 MG tablet Take 1 tablet (500 mg  total) by mouth 2 (two) times daily. 04/17/18   Antony Madura, PA-C  naproxen sodium (ANAPROX) 220 MG tablet Take 440 mg by mouth every 12 (twelve) hours as needed (PAIN).    [provider]  nitroGLYCERIN (NITROSTAT) 0.4 MG SL tablet Place 0.4 mg under the tongue every 5 (five) minutes as needed for chest pain.    [provider]  traMADol (ULTRAM) 50 MG tablet Take 1 tablet (50 mg total) by mouth every 6 (six) hours as needed for moderate pain or severe pain. 04/17/18   Antony Madura, PA-C  trimethoprim-polymyxin b (POLYTRIM) ophthalmic solution Place 2 drops into the right eye every 6 (six) hours. 05/01/19   Bing Neighbors, FNP    Family History Family History  Problem Relation Age of Onset  . Hypertension Mother   . Hypertension Father     Social History Social History   Tobacco Use  . Smoking status: Current Every Day Smoker    Packs/day: 1.00    Types: Cigarettes  . Smokeless tobacco: Never Used  Substance Use Topics  . Alcohol use: Yes    Comment: occasionally  . Drug use: Yes    Types: Marijuana     Allergies   Amoxicillin, Ampicillin, Penicillins, and Robitussin (alcohol free) [guaifenesin]   Review of Systems Review of Systems Per HPI  Physical Exam Triage Vital Signs ED Triage Vitals  Enc  Vitals Group     BP 03/14/21 1013 126/88     Pulse Rate 03/14/21 1013 81     Resp 03/14/21 1013 19     Temp 03/14/21 1013 98.8 F (37.1 C)     Temp Source 03/14/21 1013 Oral     SpO2 03/14/21 1013 99 %     Weight --      Height --      Head Circumference --      Peak Flow --      Pain Score 03/14/21 1014 7     Pain Loc --      Pain Edu? --      Excl. in GC? --    No data found.  Updated Vital Signs BP 126/88 (BP Location: Right Arm)   Pulse 81   Temp 98.8 F (37.1 C) (Oral)   Resp 19   SpO2 99%   Visual Acuity Right Eye Distance:   Left Eye Distance:   Bilateral Distance:    Right Eye Near:   Left Eye Near:    Bilateral Near:      Physical Exam Vitals and nursing note reviewed.  Constitutional:      Appearance: Normal appearance.  HENT:     Head: Atraumatic.     Mouth/Throat:     Mouth: Mucous membranes are moist.     Pharynx: Oropharynx is clear.  Eyes:     Extraocular Movements: Extraocular movements intact.     Conjunctiva/sclera: Conjunctivae normal.  Cardiovascular:     Rate and Rhythm: Normal rate and regular rhythm.     Heart sounds: Normal heart sounds.  Pulmonary:     Effort: Pulmonary effort is normal. No respiratory distress.     Breath sounds: Normal breath sounds. No wheezing or rales.  Abdominal:     General: Bowel sounds are normal. There is no distension.     Palpations: Abdomen is soft.     Tenderness: There is no abdominal tenderness. There is no right CVA tenderness or guarding.  Musculoskeletal:        General: Tenderness present. No deformity. Normal range of motion.     Cervical back: Normal range of motion and neck supple.     Comments: Right SCM, trapezius, pectoral muscle tender to palpation, spasms with lifting of right arm Grip strength full and equal bilateral hands  Skin:    General: Skin is warm and dry.  Neurological:     General: No focal deficit present.     Mental Status: He is oriented to person, place, and time.     Comments: Bilateral upper extremities neurovascularly intact  Psychiatric:        Mood and Affect: Mood normal.        Thought Content: Thought content normal.        Judgment: Judgment normal.    UC Treatments / Results  Labs (all labs ordered are listed, but only abnormal results are displayed) Labs Reviewed - No data to display  EKG  Radiology No results found.  Procedures Procedures (including critical care time)  Medications Ordered in UC Medications  ketorolac (TORADOL) injection 60 mg (60 mg Intramuscular Given 03/14/21 1142)  dexamethasone (DECADRON) injection 10 mg (10 mg Intramuscular Given 03/14/21 1140)   Initial Impression /  Assessment and Plan / UC Course  I have reviewed the triage vital signs and the nursing notes.  Pertinent labs & imaging results that were available during my care of the patient were reviewed by  me and considered in my medical decision making (see chart for details).     Vitals and exam very reassuring, EKG normal sinus rhythm without any acute ST or T wave changes.  Exam findings consistent with muscular strain and spasm.  Given IM Decadron and Toradol in clinic, will send home on Flexeril for as needed use.  Discussed supportive home care and rest, work note given.  Final Clinical Impressions(s) / UC Diagnoses   Final diagnoses:  Pectoralis muscle strain, initial encounter  Strain of neck muscle, initial encounter   Discharge Instructions   None    ED Prescriptions    Medication Sig Dispense Auth. Provider   cyclobenzaprine (FLEXERIL) 10 MG tablet Take 1 tablet (10 mg total) by mouth 2 (two) times daily as needed for muscle spasms. Not drink alcohol or drive while taking this medication.  May cause drowsiness. 20 tablet Particia Nearing, [place]     PDMP not reviewed this encounter.   Particia Nearing, [place] 03/14/21 1205

## 2021-03-14 NOTE — ED Triage Notes (Addendum)
Pt in with c/o right side, neck and shoulder pain that radiates to his right chest x 4 days  Pt states the more he tries to stand up straight the more pain his chest. Pt also c/o sob. 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   Pt denies any nausea, but states he is having intermittent dizziness

## 2021-05-22 ENCOUNTER — Other Ambulatory Visit: Payer: Self-pay

## 2021-05-22 ENCOUNTER — Encounter (HOSPITAL_COMMUNITY): Payer: Self-pay | Admitting: Emergency Medicine

## 2021-05-22 ENCOUNTER — Ambulatory Visit (HOSPITAL_COMMUNITY)
Admission: EM | Admit: 2021-05-22 | Discharge: 2021-05-22 | Disposition: A | Discharge status: Home / Self Care | Payer: BC Managed Care – PPO

## 2021-05-22 DIAGNOSIS — J3489 Other specified disorders of nose and nasal sinuses: Secondary | ICD-10-CM

## 2021-05-22 NOTE — ED Provider Notes (Signed)
[facility]    CSN: [id number] Arrival date & time: 05/22/21  1618      History   Chief Complaint Chief Complaint  Patient presents with   Nose Problem    HPI [name] is a 40 y.o. male.   Patient here for evaluation of perforated septum.  Reports being evaluated at plasma center and was told that he had a perforated septum that he would need to be evaluated for prior to donating.  Reports donating plasma previously with no issues.  Denies having perforated septum previously.  Denies any nasal pain or trauma.  Denies any recent drug use, other than marijuana.  No nose bleeds.  Denies any trauma, injury, or other precipitating event.  Denies any specific alleviating or aggravating factors.  Denies any fevers, chest pain, shortness of breath, N/V/D, numbness, tingling, weakness, abdominal pain, or headaches.    The history is provided by the patient.   Past Medical History:  Diagnosis Date   Anemia    baseline Hb- 12, microcytic   Arthritis    Gout    Hypertension    Polysubstance abuse (HCC)    alcohol,cocaine, marijuana, tobacco   Rheumatoid arteritis (HCC)    Tendonitis     Patient Active Problem List   Diagnosis Date Noted   CPK, ABNORMAL 03/26/2010   HIP PAIN, RIGHT 03/21/2010   KNEE PAIN, RIGHT, ACUTE 02/02/2010   GERD 04/20/2009   HYPERTENSION NEC 01/11/2009    History reviewed. No pertinent surgical history.     Home Medications    Prior to Admission medications   Medication Sig Start Date End Date Taking? Authorizing Provider  cyclobenzaprine (FLEXERIL) 10 MG tablet Take 1 tablet (10 mg total) by mouth 2 (two) times daily as needed for muscle spasms. Not drink alcohol or drive while taking this medication.  May cause drowsiness. Patient not taking: Reported on 05/22/2021 03/14/21   Particia Nearing, PA-C  ibuprofen (ADVIL) 600 MG tablet Take 1 tablet (600 mg total) by mouth every 6 (six) hours as needed. Patient not taking: Reported  on 05/22/2021 02/05/20   McDonald, Mia A, PA-C  naproxen (NAPROSYN) 500 MG tablet Take 1 tablet (500 mg total) by mouth 2 (two) times daily. Patient not taking: Reported on 05/22/2021 04/17/18   Antony Madura, PA-C  naproxen sodium (ANAPROX) 220 MG tablet Take 440 mg by mouth every 12 (twelve) hours as needed (PAIN). Patient not taking: Reported on 05/22/2021    [provider]  nitroGLYCERIN (NITROSTAT) 0.4 MG SL tablet Place 0.4 mg under the tongue every 5 (five) minutes as needed for chest pain.    [provider]  traMADol (ULTRAM) 50 MG tablet Take 1 tablet (50 mg total) by mouth every 6 (six) hours as needed for moderate pain or severe pain. Patient not taking: Reported on 05/22/2021 04/17/18   Antony Madura, PA-C  trimethoprim-polymyxin b (POLYTRIM) ophthalmic solution Place 2 drops into the right eye every 6 (six) hours. Patient not taking: Reported on 05/22/2021 05/01/19   Bing Neighbors, FNP    Family History Family History  Problem Relation Age of Onset   Hypertension Mother    Hypertension Father     Social History Social History   Tobacco Use   Smoking status: Every Day    Packs/day: 1.00    Types: Cigarettes   Smokeless tobacco: Never  Vaping Use   Vaping Use: Never used  Substance Use Topics   Alcohol use: Yes  Comment: occasionally   Drug use: Yes    Types: Marijuana     Allergies   Amoxicillin, Ampicillin, Penicillins, and Robitussin (alcohol free) [guaifenesin]   Review of Systems Review of Systems  All other systems reviewed and are negative.   Physical Exam Triage Vital Signs ED Triage Vitals  Enc Vitals Group     BP 05/22/21 1742 (!) 137/93     Pulse Rate 05/22/21 1742 71     Resp 05/22/21 1742 20     Temp 05/22/21 1742 98.6 F (37 C)     Temp Source 05/22/21 1742 Oral     SpO2 05/22/21 1742 96 %     Weight --      Height --      Head Circumference --      Peak Flow --      Pain Score 05/22/21 1739 0     Pain Loc --      Pain  Edu? --      Excl. in GC? --    No data found.  Updated Vital Signs BP (!) 137/93 (BP Location: Right Arm)   Pulse 71   Temp 98.6 F (37 C) (Oral)   Resp 20   SpO2 96%   Visual Acuity Right Eye Distance:   Left Eye Distance:   Bilateral Distance:    Right Eye Near:   Left Eye Near:    Bilateral Near:     Physical Exam Vitals and nursing note reviewed.  Constitutional:      General: He is not in acute distress.    Appearance: Normal appearance. He is not ill-appearing, toxic-appearing or diaphoretic.  HENT:     Head: Normocephalic and atraumatic.     Nose: Signs of injury (septual perforation noted, no bleeding) present. No nasal tenderness or congestion.  Eyes:     Conjunctiva/sclera: Conjunctivae normal.  Cardiovascular:     Rate and Rhythm: Normal rate.     Pulses: Normal pulses.  Pulmonary:     Effort: Pulmonary effort is normal.  Abdominal:     General: Abdomen is flat.  Musculoskeletal:        General: Normal range of motion.     Cervical back: Normal range of motion.  Skin:    General: Skin is warm and dry.  Neurological:     General: No focal deficit present.     Mental Status: He is alert and oriented to person, place, and time.  Psychiatric:        Mood and Affect: Mood normal.     UC Treatments / Results  Labs (all labs ordered are listed, but only abnormal results are displayed) Labs Reviewed - No data to display  EKG   Radiology No results found.  Procedures Procedures (including critical care time)  Medications Ordered in UC Medications - No data to display  Initial Impression / Assessment and Plan / UC Course  I have reviewed the triage vital signs and the nursing notes.  Pertinent labs & imaging results that were available during my care of the patient were reviewed by me and considered in my medical decision making (see chart for details).    Assessment negative for red flags or concerns.  Recommend follow up with a primary  care for possible referral to ENT for further evaluation of septal perforation.  PCP assistance started.  Final Clinical Impressions(s) / UC Diagnoses   Final diagnoses:  Nasal septal perforation     Discharge Instructions  Get established and follow up with a primary care provider for further evaluation of your perforated septum.  You may need a referral to ENT (Ear, Nose, and Throat).   Someone should contact you to help you get set up with a primary care.       ED Prescriptions   None    PDMP not reviewed this encounter.   Ivette Loyal, NP 05/22/21 [phone]

## 2021-05-22 NOTE — ED Triage Notes (Signed)
Patient went to donate plasma.  Reports physical was due today.  Plasma location reported patient has a perforated nasal septum.  Requesting evaluation .  Patient has a form with him

## 2021-05-22 NOTE — Discharge Instructions (Addendum)
Get established and follow up with a primary care provider for further evaluation of your perforated septum.  You may need a referral to ENT (Ear, Nose, and Throat).   Someone should contact you to help you get set up with a primary care.

## 2021-05-22 NOTE — ED Notes (Signed)
Patient to bathroom 

## 2021-11-28 ENCOUNTER — Ambulatory Visit (INDEPENDENT_AMBULATORY_CARE_PROVIDER_SITE_OTHER): Payer: BC Managed Care – PPO

## 2021-11-28 ENCOUNTER — Other Ambulatory Visit: Payer: Self-pay

## 2021-11-28 ENCOUNTER — Ambulatory Visit (HOSPITAL_COMMUNITY)
Admission: EM | Admit: 2021-11-28 | Discharge: 2021-11-28 | Disposition: A | Discharge status: Home / Self Care | Payer: BC Managed Care – PPO | Attending: Physician Assistant | Admitting: Physician Assistant

## 2021-11-28 ENCOUNTER — Encounter (HOSPITAL_COMMUNITY): Payer: Self-pay | Admitting: Emergency Medicine

## 2021-11-28 DIAGNOSIS — M542 Cervicalgia: Secondary | ICD-10-CM | POA: Diagnosis not present

## 2021-11-28 MED ORDER — PREDNISONE 20 MG PO TABS: 40.0000 mg | ORAL_TABLET | Freq: Every day | ORAL | 0 refills | Status: AC

## 2021-11-28 MED ORDER — CYCLOBENZAPRINE HCL 10 MG PO TABS: 10.0000 mg | ORAL_TABLET | Freq: Two times a day (BID) | ORAL | 0 refills | Status: DC | PRN

## 2021-11-28 NOTE — Discharge Instructions (Signed)
  Please report to ED with any worsening symptoms. 
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
## 2022-01-18 ENCOUNTER — Ambulatory Visit (HOSPITAL_COMMUNITY)
Admission: EM | Admit: 2022-01-18 | Discharge: 2022-01-18 | Disposition: A | Discharge status: Home / Self Care | Payer: BC Managed Care – PPO

## 2022-01-18 ENCOUNTER — Emergency Department (HOSPITAL_COMMUNITY)
Admission: EM | Admit: 2022-01-18 | Discharge: 2022-01-18 | Disposition: A | Discharge status: Home / Self Care | Payer: BC Managed Care – PPO | Attending: Emergency Medicine | Admitting: Emergency Medicine

## 2022-01-18 ENCOUNTER — Encounter (HOSPITAL_COMMUNITY): Payer: Self-pay | Admitting: Emergency Medicine

## 2022-01-18 DIAGNOSIS — R6884 Jaw pain: Secondary | ICD-10-CM | POA: Insufficient documentation

## 2022-01-18 DIAGNOSIS — Z5321 Procedure and treatment not carried out due to patient leaving prior to being seen by health care provider: Secondary | ICD-10-CM | POA: Diagnosis not present
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
## 2022-01-18 NOTE — ED Notes (Signed)
Pt asked about when he was going back to a room, explained to patient the process. Patient cussed and asked for a bus pass, this NT explained to patient that bus passes cannot be given out until seen and discharged by a doctor. Pt stated he would walk home then and left [facility].  ?
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
## 2022-04-29 ENCOUNTER — Ambulatory Visit
Admission: EM | Admit: 2022-04-29 | Discharge: 2022-04-29 | Disposition: A | Discharge status: Home / Self Care | Payer: BC Managed Care – PPO | Attending: Internal Medicine | Admitting: Internal Medicine

## 2022-04-29 ENCOUNTER — Encounter: Payer: Self-pay | Admitting: Emergency Medicine

## 2022-04-29 DIAGNOSIS — M5441 Lumbago with sciatica, right side: Secondary | ICD-10-CM | POA: Diagnosis not present

## 2022-04-29 MED ORDER — PREDNISONE 20 MG PO TABS: 40.0000 mg | ORAL_TABLET | Freq: Every day | ORAL | 0 refills | Status: AC

## 2022-04-29 MED ORDER — CYCLOBENZAPRINE HCL 5 MG PO TABS: 5.0000 mg | ORAL_TABLET | Freq: Two times a day (BID) | ORAL | 0 refills | Status: DC | PRN

## 2022-04-29 MED ORDER — KETOROLAC TROMETHAMINE 30 MG/ML IJ SOLN
30.0000 mg | Freq: Once | INTRAMUSCULAR | Status: AC
  Administered 2022-04-29: 30 mg via INTRAMUSCULAR

## 2022-04-29 NOTE — ED Provider Notes (Signed)
[facility]    CSN: [id number] Arrival date & time: 04/29/22  1649      History   Chief Complaint Chief Complaint  Patient presents with   Back Pain    HPI [name] is a 41 y.o. male.   Patient presents with right lower back pain that radiates down buttocks and into right leg.  Patient states that it started this morning around 6 AM but denies any obvious injury.  Denies history of chronic back pain.  Patient reports that he works at [employer] and does a lot of heavy lifting on a daily basis.  He has not taken any medications for pain.  Denies any numbness or tingling.  Denies urinary frequency, urinary or bowel incontinence, saddle anesthesia.   Back Pain   Past Medical History:  Diagnosis Date   Anemia    baseline Hb- 12, microcytic   Arthritis    Gout    Hypertension    Polysubstance abuse ([facility])    alcohol,cocaine, marijuana, tobacco   Rheumatoid arteritis ([facility])    Tendonitis     Patient Active Problem List   Diagnosis Date Noted   CPK, ABNORMAL 03/26/2010   HIP PAIN, RIGHT 03/21/2010   KNEE PAIN, RIGHT, ACUTE 02/02/2010   GERD 04/20/2009   HYPERTENSION NEC 01/11/2009    History reviewed. No pertinent surgical history.     Home Medications    Prior to Admission medications   Medication Sig Start Date End Date Taking? Authorizing Provider  cyclobenzaprine (FLEXERIL) 5 MG tablet Take 1 tablet (5 mg total) by mouth 2 (two) times daily as needed for muscle spasms. 04/29/22  Yes Chris Narasimhan, Rolly Salter E, FNP  predniSONE (DELTASONE) 20 MG tablet Take 2 tablets (40 mg total) by mouth daily for 5 days. 04/29/22 05/04/22 Yes Alexandre Lightsey, Acie Fredrickson, FNP  ibuprofen (ADVIL) 600 MG tablet Take 1 tablet (600 mg total) by mouth every 6 (six) hours as needed. Patient not taking: Reported on 05/22/2021 02/05/20   McDonald, Mia A, PA-C  naproxen (NAPROSYN) 500 MG tablet Take 1 tablet (500 mg total) by mouth 2 (two) times daily. Patient not taking: Reported on 05/22/2021 04/17/18    Antony Madura, PA-C  naproxen sodium (ANAPROX) 220 MG tablet Take 440 mg by mouth every 12 (twelve) hours as needed (PAIN). Patient not taking: Reported on 05/22/2021    [provider]  nitroGLYCERIN (NITROSTAT) 0.4 MG SL tablet Place 0.4 mg under the tongue every 5 (five) minutes as needed for chest pain.    [provider]  traMADol (ULTRAM) 50 MG tablet Take 1 tablet (50 mg total) by mouth every 6 (six) hours as needed for moderate pain or severe pain. Patient not taking: Reported on 05/22/2021 04/17/18   Antony Madura, PA-C  trimethoprim-polymyxin b (POLYTRIM) ophthalmic solution Place 2 drops into the right eye every 6 (six) hours. Patient not taking: Reported on 05/22/2021 05/01/19   Bing Neighbors, FNP    Family History Family History  Problem Relation Age of Onset   Hypertension Mother    Hypertension Father     Social History Social History   Tobacco Use   Smoking status: Every Day    Packs/day: 1.00    Types: Cigarettes   Smokeless tobacco: Never  Vaping Use   Vaping Use: Never used  Substance Use Topics   Alcohol use: Yes    Comment: occasionally   Drug use: Yes    Types: Marijuana     Allergies   Amoxicillin,  Ampicillin, Penicillins, and Robitussin (alcohol free) [guaifenesin]   Review of Systems Review of Systems Per HPI  Physical Exam Triage Vital Signs ED Triage Vitals  Enc Vitals Group     BP 04/29/22 1710 (!) 152/82     Pulse Rate 04/29/22 1710 70     Resp 04/29/22 1710 18     Temp 04/29/22 1710 98.3 F (36.8 C)     Temp Source 04/29/22 1710 Oral     SpO2 04/29/22 1710 92 %     Weight 04/29/22 1711 210 lb (95.3 kg)     Height 04/29/22 1711 5\' 7"  (1.702 m)     Head Circumference --      Peak Flow --      Pain Score 04/29/22 1711 10     Pain Loc --      Pain Edu? --      Excl. in [place]? --    No data found.  Updated Vital Signs BP (!) 152/82 (BP Location: Right Arm)   Pulse 70   Temp 98.3 F (36.8 C) (Oral)   Resp 18   Ht  5\' 7"  (1.702 m)   Wt 210 lb (95.3 kg)   SpO2 96%   BMI 32.89 kg/m   Visual Acuity Right Eye Distance:   Left Eye Distance:   Bilateral Distance:    Right Eye Near:   Left Eye Near:    Bilateral Near:     Physical Exam Constitutional:      General: He is not in acute distress.    Appearance: Normal appearance. He is not toxic-appearing or diaphoretic.  HENT:     Head: Normocephalic and atraumatic.  Eyes:     Extraocular Movements: Extraocular movements intact.     Conjunctiva/sclera: Conjunctivae normal.  Pulmonary:     Effort: Pulmonary effort is normal.  Musculoskeletal:     Lumbar back: Tenderness present. No swelling, edema, deformity or bony tenderness. Positive right straight leg raise test. Negative left straight leg raise test.     Comments: Tenderness to palpation to right lower lumbar region.  No direct spinal tenderness, crepitus, step-off.  Tenderness to patient to right buttocks that extends from lumbar back as well.  Neurological:     General: No focal deficit present.     Mental Status: He is alert and oriented to person, place, and time. Mental status is at baseline.     Deep Tendon Reflexes: Reflexes are normal and symmetric.  Psychiatric:        Mood and Affect: Mood normal.        Behavior: Behavior normal.        Thought Content: Thought content normal.        Judgment: Judgment normal.      UC Treatments / Results  Labs (all labs ordered are listed, but only abnormal results are displayed) Labs Reviewed - No data to display  EKG   Radiology No results found.  Procedures Procedures (including critical care time)  Medications Ordered in UC Medications  ketorolac (TORADOL) 30 MG/ML injection 30 mg (30 mg Intramuscular Given 04/29/22 1729)    Initial Impression / Assessment and Plan / UC Course  I have reviewed the triage vital signs and the nursing notes.  Pertinent labs & imaging results that were available during my care of the patient  were reviewed by me and considered in my medical decision making (see chart for details).     Physical exam is consistent with sciatica.  IM Toradol  administered in urgent care to help alleviate inflammation.  Patient advised to avoid NSAIDs.  Prednisone and muscle relaxer also prescribed for patient.  Advised patient that muscle relaxer can cause drowsiness.  Patient has taken all of these medications before and tolerated well.  There does not appear to be any obvious contraindications to medications.  Do not think imaging is necessary given no direct spinal tenderness or injury.  Patient advised to follow-up if symptoms persist or worsen.  Patient verbalized understanding and was agreeable with plan. Final Clinical Impressions(s) / UC Diagnoses   Final diagnoses:  Acute right-sided low back pain with right-sided sciatica     Discharge Instructions      You are having sciatica lower back pain.  You are being treated with prednisone and muscle relaxer.  You have also been given an injection today in urgent care to help alleviate pain and inflammation.  Please avoid taking ibuprofen, Advil, Aleve while taking prednisone.  Please be advised that muscle relaxer can cause drowsiness and do not drive while taking this.  Also alternate ice and heat to affected area.  Follow-up if pain persists or worsens.    ED Prescriptions     Medication Sig Dispense Auth. Provider   predniSONE (DELTASONE) 20 MG tablet Take 2 tablets (40 mg total) by mouth daily for 5 days. 10 tablet [place], [place] [address], [place]   cyclobenzaprine (FLEXERIL) 5 MG tablet Take 1 tablet (5 mg total) by mouth 2 (two) times daily as needed for muscle spasms. 20 tablet [place], [place] [address], [place]      I have reviewed the PDMP during this encounter.   Gustavus Bryant, [place] 04/29/22 1746

## 2022-04-29 NOTE — Discharge Instructions (Signed)
You are having sciatica lower back pain.  You are being treated with prednisone and muscle relaxer.  You have also been given an injection today in urgent care to help alleviate pain and inflammation.  Please avoid taking ibuprofen, Advil, Aleve while taking prednisone.  Please be advised that muscle relaxer can cause drowsiness and do not drive while taking this.  Also alternate ice and heat to affected area.  Follow-up if pain persists or worsens.

## 2022-04-29 NOTE — ED Triage Notes (Signed)
Patient c/o right sided low back pain that radiates down leg since this morning.  Patient denies any injury to the leg or back, denies any pain meds.

## 2023-06-20 ENCOUNTER — Ambulatory Visit (INDEPENDENT_AMBULATORY_CARE_PROVIDER_SITE_OTHER): Payer: BC Managed Care – PPO

## 2023-06-20 ENCOUNTER — Ambulatory Visit (HOSPITAL_COMMUNITY)
Admission: EM | Admit: 2023-06-20 | Discharge: 2023-06-20 | Disposition: A | Discharge status: Home / Self Care | Payer: BC Managed Care – PPO | Attending: Emergency Medicine | Admitting: Emergency Medicine

## 2023-06-20 ENCOUNTER — Encounter (HOSPITAL_COMMUNITY): Payer: Self-pay

## 2023-06-20 DIAGNOSIS — M79671 Pain in right foot: Secondary | ICD-10-CM

## 2023-06-20 DIAGNOSIS — M109 Gout, unspecified: Secondary | ICD-10-CM | POA: Diagnosis not present

## 2023-06-20 MED ORDER — KETOROLAC TROMETHAMINE 30 MG/ML IJ SOLN
INTRAMUSCULAR | Status: AC
  Filled 2023-06-20: qty 1

## 2023-06-20 MED ORDER — KETOROLAC TROMETHAMINE 30 MG/ML IJ SOLN
30.0000 mg | Freq: Once | INTRAMUSCULAR | Status: AC
  Administered 2023-06-20: 30 mg via INTRAMUSCULAR

## 2023-06-20 MED ORDER — COLCHICINE 0.6 MG PO TABS: 0.6000 mg | ORAL_TABLET | Freq: Every day | ORAL | 0 refills | Status: AC

## 2023-06-20 NOTE — Discharge Instructions (Addendum)
Your x-ray was negative for fracture or dislocation.  I believe you are having a gout flare due to your recent dietary intake of alcohol and red meat, which are high in purine's.  I have attached the guide that discusses low purine eating, this may help prevent future gout attacks.  Please also follow-up with your primary care provider, as they can place you back on allopurinol once you are out of your acute flare.  Take the colchicine as prescribed and until your flare has resolved.   Return to clinic for new or urgent symptoms.

## 2023-06-20 NOTE — ED Provider Notes (Signed)
[facility]    CSN: [id number] Arrival date & time: 06/20/23  1530      History   Chief Complaint Chief Complaint  Patient presents with   Foot Pain    HPI [name] is a 42 y.o. male.   Presents to clinic for right foot pain that has been worsening since Tuesday.  Foot pain started Tuesday at the base of his foot extending into his second, third and fourth toe.  Later that day at work a coworker also dropped a bucket onto his foot.  He feels as if he has a cut between his toes, did not see any initial injury.  The area has been swelling and it feels warm.  He has been taking Tylenol, without much relief.  He does have a history of gout, has not had a flareup in a year and a half or so.  Reports he had no more refills on his allopurinol, so he has been out of this for a while.  Over the weekend he had a couple of 40s, and had steak on Monday.    The history is provided by the patient and medical records.  Foot Pain    Past Medical History:  Diagnosis Date   Anemia    baseline Hb- 12, microcytic   Arthritis    Gout    Hypertension    Polysubstance abuse (HCC)    alcohol,cocaine, marijuana, tobacco   Rheumatoid arteritis (HCC)    Tendonitis     Patient Active Problem List   Diagnosis Date Noted   CPK, ABNORMAL 03/26/2010   HIP PAIN, RIGHT 03/21/2010   KNEE PAIN, RIGHT, ACUTE 02/02/2010   GERD 04/20/2009   HYPERTENSION NEC 01/11/2009    History reviewed. No pertinent surgical history.     Home Medications    Prior to Admission medications   Medication Sig Start Date End Date Taking? Authorizing Provider  colchicine 0.6 MG tablet Take 1 tablet (0.6 mg total) by mouth daily. 06/20/23 07/20/23 Yes Rinaldo Ratel, [place] N, FNP  nitroGLYCERIN (NITROSTAT) 0.4 MG SL tablet Place 0.4 mg under the tongue every 5 (five) minutes as needed for chest pain.    [provider]    Family History Family History  Problem Relation Age of Onset    Hypertension Mother    Hypertension Father     Social History Social History   Tobacco Use   Smoking status: Every Day    Current packs/day: 1.00    Types: Cigarettes   Smokeless tobacco: Never  Vaping Use   Vaping status: Never Used  Substance Use Topics   Alcohol use: Yes    Comment: occasionally   Drug use: Yes    Types: Marijuana     Allergies   Amoxicillin, Ampicillin, Penicillins, and Robitussin (alcohol free) [guaifenesin]   Review of Systems Review of Systems  Musculoskeletal:  Positive for joint swelling.     Physical Exam Triage Vital Signs ED Triage Vitals  Encounter Vitals Group     BP 06/20/23 1612 (!) 171/104     Systolic BP Percentile --      Diastolic BP Percentile --      Pulse Rate 06/20/23 1612 68     Resp 06/20/23 1612 18     Temp 06/20/23 1612 98.8 F (37.1 C)     Temp Source 06/20/23 1612 Oral     SpO2 06/20/23 1612 98 %     Weight 06/20/23 1612 198 lb (89.8 kg)  Height 06/20/23 1612 5' 7.5" (1.715 m)     Head Circumference --      Peak Flow --      Pain Score 06/20/23 1610 9     Pain Loc --      Pain Education --      Exclude from Growth Chart --    No data found.  Updated Vital Signs BP (!) 171/104 (BP Location: Left Arm)   Pulse 68   Temp 98.8 F (37.1 C) (Oral)   Resp 18   Ht 5' 7.5" (1.715 m)   Wt 198 lb (89.8 kg)   SpO2 98%   BMI 30.55 kg/m   Visual Acuity Right Eye Distance:   Left Eye Distance:   Bilateral Distance:    Right Eye Near:   Left Eye Near:    Bilateral Near:     Physical Exam Vitals and nursing note reviewed.  Constitutional:      Appearance: Normal appearance.  HENT:     Head: Normocephalic and atraumatic.     Right Ear: External ear normal.     Left Ear: External ear normal.     Nose: Nose normal.     Mouth/Throat:     Mouth: Mucous membranes are moist.  Eyes:     Conjunctiva/sclera: Conjunctivae normal.  Cardiovascular:     Rate and Rhythm: Normal rate.     Pulses: Normal  pulses.          Dorsalis pedis pulses are 2+ on the right side.       Posterior tibial pulses are 2+ on the right side.  Pulmonary:     Effort: Pulmonary effort is normal. No respiratory distress.  Musculoskeletal:        General: Swelling and tenderness present. Normal range of motion.       Feet:  Feet:     Right foot:     Skin integrity: Warmth present.     Comments: Right 2nd, 3rd and 4th metatarsals are TTP, warm and slightly swollen w/ erythema. No skin breakdown or streaking noted. ROM and sensation intact.  Skin:    General: Skin is warm and dry.     Findings: Erythema present.  Neurological:     General: No focal deficit present.     Mental Status: He is alert and oriented to person, place, and time.  Psychiatric:        Mood and Affect: Mood normal.        Behavior: Behavior normal. Behavior is cooperative.      UC Treatments / Results  Labs (all labs ordered are listed, but only abnormal results are displayed) Labs Reviewed - No data to display  EKG   Radiology DG Foot Complete Right  Result Date: 06/20/2023 CLINICAL DATA:  bucket fell on foot, hx of gout EXAM: RIGHT FOOT COMPLETE - 3+ VIEW COMPARISON:  None Available. FINDINGS: There is no evidence of fracture or dislocation. There is no evidence of arthropathy or other focal bone abnormality. Soft tissues are unremarkable. IMPRESSION: Negative. Electronically Signed   By: Feliberto Harts M.D.   On: 06/20/2023 16:39    Procedures Procedures (including critical care time)  Medications Ordered in UC Medications  ketorolac (TORADOL) 30 MG/ML injection 30 mg (30 mg Intramuscular Given 06/20/23 1636)    Initial Impression / Assessment and Plan / UC Course  I have reviewed the triage vital signs and the nursing notes.  Pertinent labs & imaging results that were available during my care  of the patient were reviewed by me and considered in my medical decision making (see chart for details).  Vitals and triage  reviewed, patient is hemodynamically stable.  Second, third and fourth metatarsals of right foot are TTP w/ injury. Imaging negative for fracture or dislocation. Suspect gout d/t recent ETOH and red meat intake, not currently on allopurinol. Normal renal function in 2021. Will give IM Toradol for pain and colchicine. Work note provided. POC, f/u care and return precautions given, no questions at this time.      Final Clinical Impressions(s) / UC Diagnoses   Final diagnoses:  Acute gout of right foot, unspecified cause     Discharge Instructions      Your x-ray was negative for fracture or dislocation.  I believe you are having a gout flare due to your recent dietary intake of alcohol and red meat, which are high in purine's.  I have attached the guide that discusses low purine eating, this may help prevent future gout attacks.  Please also follow-up with your primary care provider, as they can place you back on allopurinol once you are out of your acute flare.  Take the colchicine as prescribed and until your flare has resolved.   Return to clinic for new or urgent symptoms.      ED Prescriptions     Medication Sig Dispense Auth. Provider   colchicine 0.6 MG tablet Take 1 tablet (0.6 mg total) by mouth daily. 30 tablet Daiya Tamer, [place] N, [place]      PDMP not reviewed this encounter.   Claxton Levitz, [place] N, [place] 06/20/23 [phone]

## 2023-06-20 NOTE — ED Triage Notes (Signed)
right foot pain onset Tuesday. Patient states he had a bucket fall on the foot Tuesday. Patient also has a history of gout in that foot. Feels like a lot of pressure and tenderness.   Patient took tylenol with no relief.

## 2024-11-02 ENCOUNTER — Encounter (HOSPITAL_COMMUNITY): Payer: Self-pay

## 2024-11-02 ENCOUNTER — Ambulatory Visit (HOSPITAL_COMMUNITY)
Admission: EM | Admit: 2024-11-02 | Discharge: 2024-11-02 | Disposition: A | Discharge status: Home / Self Care | Payer: Self-pay | Attending: Emergency Medicine | Admitting: Emergency Medicine

## 2024-11-02 DIAGNOSIS — F17200 Nicotine dependence, unspecified, uncomplicated: Secondary | ICD-10-CM

## 2024-11-02 DIAGNOSIS — M62830 Muscle spasm of back: Secondary | ICD-10-CM

## 2024-11-02 MED ORDER — CYCLOBENZAPRINE HCL 10 MG PO TABS: 10.0000 mg | ORAL_TABLET | Freq: Two times a day (BID) | ORAL | 0 refills | Status: AC | PRN

## 2024-11-02 MED ORDER — NAPROXEN 500 MG PO TABS: 500.0000 mg | ORAL_TABLET | Freq: Two times a day (BID) | ORAL | 0 refills | Status: AC

## 2024-11-02 MED ORDER — KETOROLAC TROMETHAMINE 30 MG/ML IJ SOLN
INTRAMUSCULAR | Status: AC
  Filled 2024-11-02: qty 1

## 2024-11-02 MED ORDER — KETOROLAC TROMETHAMINE 30 MG/ML IJ SOLN
30.0000 mg | Freq: Once | INTRAMUSCULAR | Status: AC
  Administered 2024-11-02: 30 mg via INTRAMUSCULAR

## 2024-11-02 NOTE — Discharge Instructions (Addendum)
 Take home meds as directed. Flexeril  as prescribed. May use heat or ice to back for comfort 20 min 3 x daily.  May use lidocaine  patch or biofreeze for pain.  Please follow up with PCP, may need to referral to physical therapy for further back pain management.  GO immediately to nearest ER or call 9-1-1 for loss of bowel and bladder,loss of function, saddle numbness, etc.  Pt advised otc tylenol  as label directed for pain Avoid lifting,turning,bending as this will aggravate your back

## 2024-11-02 NOTE — ED Provider Notes (Signed)
 " [facility]    CSN: [id number] Arrival date & time: 11/02/24  1613      History   Chief Complaint Chief Complaint  Patient presents with   Back Pain    HPI [name] is a 44 y.o. male.   [name], 44 year old male pt, presents to urgent care for evaluation of low back pain x 1 week that travels down both legs. Pt has tried tylenol  and advil  without relief. Pt denies any loss of function,saddle numbness, loss of bowel and bladder. Pt works at [employer], pain with certain movementspasms  PMH: Rheumatoid arthritis, anemia, polysubstance abuse, HTN  The history is provided by the patient. No language interpreter was used.  Back Pain Associated symptoms: no dysuria and no fever     Past Medical History:  Diagnosis Date   Anemia    baseline Hb- 12, microcytic   Arthritis    Gout    Hypertension    Polysubstance abuse (HCC)    alcohol,cocaine, marijuana, tobacco   Rheumatoid arteritis (HCC)    Tendonitis     Patient Active Problem List   Diagnosis Date Noted   Muscle spasm of back 11/02/2024   Smoker 11/02/2024   CPK, ABNORMAL 03/26/2010   HIP PAIN, RIGHT 03/21/2010   KNEE PAIN, RIGHT, ACUTE 02/02/2010   GERD 04/20/2009   HYPERTENSION NEC 01/11/2009    History reviewed. No pertinent surgical history.     Home Medications    Prior to Admission medications  Medication Sig Start Date End Date Taking? Authorizing Provider  cyclobenzaprine  (FLEXERIL ) 10 MG tablet Take 1 tablet (10 mg total) by mouth 2 (two) times daily as needed for up to 5 days for muscle spasms. 11/02/24 11/07/24 Yes Coreon Simkins, NP  naproxen  (NAPROSYN ) 500 MG tablet Take 1 tablet (500 mg total) by mouth 2 (two) times daily with a meal for 5 days. 11/02/24 11/07/24 Yes Kairi Tufo, [place], NP  colchicine  0.6 MG tablet Take 1 tablet (0.6 mg total) by mouth daily. Patient not taking: Reported on 11/02/2024 06/20/23 07/20/23  [place], [place]  G, FNP  nitroGLYCERIN  (NITROSTAT) 0.4 MG SL tablet Place 0.4 mg under the tongue every 5 (five) minutes as needed for chest pain.    [provider]    Family History Family History  Problem Relation Age of Onset   Hypertension Mother    Hypertension Father     Social History Social History[1]   Allergies   Amoxicillin, Ampicillin, Penicillins, and Robitussin (alcohol free) [guaifenesin]   Review of Systems Review of Systems  Constitutional:  Negative for fever.  Genitourinary:  Negative for dysuria.  Musculoskeletal:  Positive for back pain and myalgias.  All other systems reviewed and are negative.    Physical Exam Triage Vital Signs ED Triage Vitals  Encounter Vitals Group     BP 11/02/24 1730 127/87     Girls Systolic BP Percentile --      Girls Diastolic BP Percentile --      Boys Systolic BP Percentile --      Boys Diastolic BP Percentile --      Pulse Rate 11/02/24 1730 86     Resp 11/02/24 1730 16     Temp 11/02/24 1730 98.4 F (36.9 C)     Temp Source 11/02/24 1730 Oral     SpO2 11/02/24 1730 97 %     Weight --      Height --      Head  Circumference --      Peak Flow --      Pain Score 11/02/24 1727 10     Pain Loc --      Pain Education --      Exclude from Growth Chart --    No data found.  Updated Vital Signs BP 127/87 (BP Location: Right Arm)   Pulse 86   Temp 98.4 F (36.9 C) (Oral)   Resp 16   SpO2 97%   Visual Acuity Right Eye Distance:   Left Eye Distance:   Bilateral Distance:    Right Eye Near:   Left Eye Near:    Bilateral Near:     Physical Exam Vitals and nursing note reviewed.  Constitutional:      General: He is not in acute distress.    Appearance: He is well-developed.  HENT:     Head: Normocephalic and atraumatic.  Eyes:     Conjunctiva/sclera: Conjunctivae normal.  Cardiovascular:     Rate and Rhythm: Normal rate and regular rhythm.     Pulses:          Dorsalis pedis pulses are 2+ on the right side and 2+ on the left  side.     Heart sounds: No murmur heard. Pulmonary:     Effort: Pulmonary effort is normal. No respiratory distress.     Breath sounds: Normal breath sounds.  Abdominal:     Palpations: Abdomen is soft.     Tenderness: There is no abdominal tenderness.  Musculoskeletal:        General: No swelling.     Cervical back: Neck supple.     Lumbar back: Spasms and tenderness present. Positive right straight leg raise test and positive left straight leg raise test.     Comments: @45  degrees bilaterally  Skin:    General: Skin is warm and dry.     Capillary Refill: Capillary refill takes less than 2 seconds.  Neurological:     General: No focal deficit present.     Mental Status: He is alert and oriented to person, place, and time.     GCS: GCS eye subscore is 4. GCS verbal subscore is 5. GCS motor subscore is 6.     Cranial Nerves: Cranial nerves 2-12 are intact. No cranial nerve deficit.     Sensory: Sensation is intact.     Motor: Motor function is intact.  Psychiatric:        Attention and Perception: Attention normal.        Mood and Affect: Mood normal.        Speech: Speech normal.        Behavior: Behavior is cooperative.      UC Treatments / Results  Labs (all labs ordered are listed, but only abnormal results are displayed) Labs Reviewed - No data to display  EKG   Radiology No results found.  Procedures Procedures (including critical care time)  Medications Ordered in UC Medications  ketorolac  (TORADOL ) 30 MG/ML injection 30 mg (30 mg Intramuscular Given 11/02/24 1803)    Initial Impression / Assessment and Plan / UC Course  I have reviewed the triage vital signs and the nursing notes.  Pertinent labs & imaging results that were available during my care of the patient were reviewed by me and considered in my medical decision making (see chart for details).     Discussed exam findings and plan of care with patient :take home meds as directed. Flexeril  as  prescribed.  May use heat or ice to back for comfort 20 min 3 x daily.  May use lidocaine  patch or biofreeze for pain.  Please follow up with PCP, may need to referral to physical therapy for further back pain management.  GO immediately to nearest ER or call 9-1-1 for loss of bowel and bladder,loss of function, saddle numbness, etc.  Pt advised otc tylenol  as label directed for pain Avoid lifting,turning,bending as this will aggravate your back.  Patient verbalized understanding to this provider, shot of Toradol  given in office.  Ddx: Muscle spasm of back, muscle strain, sciatica Final Clinical Impressions(s) / UC Diagnoses   Final diagnoses:  Muscle spasm of back  Smoker     Discharge Instructions      Take home meds as directed. Flexeril  as prescribed. May use heat or ice to back for comfort 20 min 3 x daily.  May use lidocaine  patch or biofreeze for pain.  Please follow up with PCP, may need to referral to physical therapy for further back pain management.  GO immediately to nearest ER or call 9-1-1 for loss of bowel and bladder,loss of function, saddle numbness, etc.  Pt advised otc tylenol  as label directed for pain Avoid lifting,turning,bending as this will aggravate your back     ED Prescriptions     Medication Sig Dispense Auth. Provider   cyclobenzaprine  (FLEXERIL ) 10 MG tablet Take 1 tablet (10 mg total) by mouth 2 (two) times daily as needed for up to 5 days for muscle spasms. 10 tablet Hillel Card, NP   naproxen  (NAPROSYN ) 500 MG tablet Take 1 tablet (500 mg total) by mouth 2 (two) times daily with a meal for 5 days. 10 tablet Ivonna Kinnick, [place], NP      PDMP not reviewed this encounter.     [1]  Social History Tobacco Use   Smoking status: Every Day    Current packs/day: 1.00    Types: Cigarettes   Smokeless tobacco: Never  Vaping Use   Vaping status: Never Used  Substance Use Topics   Alcohol use: Yes    Comment: occasionally   Drug use: Yes     Types: Marijuana     Camrin Lapre, NP 11/02/24 1918  "

## 2024-11-02 NOTE — ED Triage Notes (Signed)
 Patient here today with c/o low back pain upon waking 1 week ago that shoots down both legs. Patient has tried taking Advil  and Tylenol  with no relief.
